# Patient Record
Sex: Female | Born: 1940
Health system: Southern US, Community
[De-identification: ages and names within clinical notes are randomized; demographics above are authoritative.]

## PROBLEM LIST (undated history)

## (undated) DIAGNOSIS — K5790 Diverticulosis of intestine, part unspecified, without perforation or abscess without bleeding: Secondary | ICD-10-CM

## (undated) DIAGNOSIS — D126 Benign neoplasm of colon, unspecified: Secondary | ICD-10-CM

## (undated) DIAGNOSIS — F0390 Unspecified dementia without behavioral disturbance: Secondary | ICD-10-CM

## (undated) DIAGNOSIS — I10 Essential (primary) hypertension: Secondary | ICD-10-CM

## (undated) DIAGNOSIS — K2210 Ulcer of esophagus without bleeding: Secondary | ICD-10-CM

## (undated) DIAGNOSIS — E785 Hyperlipidemia, unspecified: Secondary | ICD-10-CM

## (undated) DIAGNOSIS — K449 Diaphragmatic hernia without obstruction or gangrene: Secondary | ICD-10-CM

## (undated) DIAGNOSIS — E079 Disorder of thyroid, unspecified: Secondary | ICD-10-CM

## (undated) HISTORY — DX: Essential (primary) hypertension: I10

## (undated) HISTORY — DX: Ulcer of esophagus without bleeding: K22.10

## (undated) HISTORY — DX: Diaphragmatic hernia without obstruction or gangrene: K44.9

## (undated) HISTORY — DX: Hyperlipidemia, unspecified: E78.5

## (undated) HISTORY — DX: Benign neoplasm of colon, unspecified: D12.6

## (undated) HISTORY — DX: Diverticulosis of intestine, part unspecified, without perforation or abscess without bleeding: K57.90

## (undated) HISTORY — PX: THYROIDECTOMY, PARTIAL: SHX18

## (undated) HISTORY — PX: TONSILLECTOMY: SUR1361

## (undated) HISTORY — DX: Disorder of thyroid, unspecified: E07.9

---

## 2005-04-10 DIAGNOSIS — D126 Benign neoplasm of colon, unspecified: Secondary | ICD-10-CM

## 2005-04-10 HISTORY — DX: Benign neoplasm of colon, unspecified: D12.6

## 2006-10-17 ENCOUNTER — Encounter: Payer: Self-pay | Admitting: Family Medicine

## 2007-01-13 ENCOUNTER — Encounter: Payer: Self-pay | Admitting: Family Medicine

## 2007-04-06 ENCOUNTER — Ambulatory Visit: Payer: Self-pay | Admitting: Family Medicine

## 2007-04-06 DIAGNOSIS — E039 Hypothyroidism, unspecified: Secondary | ICD-10-CM

## 2007-04-06 DIAGNOSIS — I10 Essential (primary) hypertension: Secondary | ICD-10-CM | POA: Insufficient documentation

## 2007-04-06 DIAGNOSIS — E785 Hyperlipidemia, unspecified: Secondary | ICD-10-CM | POA: Insufficient documentation

## 2007-04-06 DIAGNOSIS — B009 Herpesviral infection, unspecified: Secondary | ICD-10-CM | POA: Insufficient documentation

## 2007-04-07 ENCOUNTER — Ambulatory Visit: Payer: Self-pay | Admitting: Family Medicine

## 2007-04-08 ENCOUNTER — Encounter: Payer: Self-pay | Admitting: Family Medicine

## 2007-04-08 LAB — CONVERTED CEMR LAB
ALT: 16 units/L (ref 0–35)
Albumin: 4 g/dL (ref 3.5–5.2)
Alkaline Phosphatase: 50 units/L (ref 39–117)
BUN: 14 mg/dL (ref 6–23)
Calcium: 9.9 mg/dL (ref 8.4–10.5)
Creatinine, Ser: 0.9 mg/dL (ref 0.4–1.2)
GFR calc Af Amer: 80 mL/min
HDL: 53.4 mg/dL (ref >39.0)
Potassium: 3.5 meq/L (ref 3.5–5.1)
TSH: 1.89 microintl units/mL (ref 0.35–5.50)
Triglycerides: 148 mg/dL (ref 0–149)
VLDL: 30 mg/dL (ref 0–40)

## 2007-04-09 LAB — CONVERTED CEMR LAB
PTH: 60.7 pg/mL (ref 14.0–72.0)
Vit D, 1,25-Dihydroxy: 41 (ref 30–89)

## 2007-04-15 ENCOUNTER — Telehealth: Payer: Self-pay | Admitting: Family Medicine

## 2007-05-05 ENCOUNTER — Encounter: Payer: Self-pay | Admitting: Family Medicine

## 2007-05-18 ENCOUNTER — Encounter (INDEPENDENT_AMBULATORY_CARE_PROVIDER_SITE_OTHER): Payer: Self-pay | Admitting: *Deleted

## 2007-05-22 ENCOUNTER — Encounter: Payer: Self-pay | Admitting: Family Medicine

## 2007-06-09 ENCOUNTER — Encounter: Payer: Self-pay | Admitting: Family Medicine

## 2007-06-26 ENCOUNTER — Telehealth: Payer: Self-pay | Admitting: Family Medicine

## 2007-07-01 ENCOUNTER — Ambulatory Visit: Payer: Self-pay | Admitting: Family Medicine

## 2007-07-21 ENCOUNTER — Encounter (INDEPENDENT_AMBULATORY_CARE_PROVIDER_SITE_OTHER): Payer: Self-pay | Admitting: Obstetrics and Gynecology

## 2007-07-21 ENCOUNTER — Ambulatory Visit (HOSPITAL_COMMUNITY): Admission: RE | Admit: 2007-07-21 | Discharge: 2007-07-21 | Payer: Self-pay | Admitting: Obstetrics and Gynecology

## 2007-09-11 ENCOUNTER — Ambulatory Visit: Payer: Self-pay | Admitting: Family Medicine

## 2007-09-11 LAB — CONVERTED CEMR LAB
OCCULT 1: NEGATIVE
OCCULT 2: NEGATIVE

## 2007-10-09 ENCOUNTER — Ambulatory Visit: Payer: Self-pay | Admitting: Family Medicine

## 2007-10-14 ENCOUNTER — Ambulatory Visit: Payer: Self-pay | Admitting: Family Medicine

## 2007-10-14 LAB — CONVERTED CEMR LAB
BUN: 13 mg/dL (ref 6–23)
Calcium: 10.3 mg/dL (ref 8.4–10.5)
Creatinine, Ser: 1.1 mg/dL (ref 0.4–1.2)
GFR calc Af Amer: 64 mL/min
Glucose, Bld: 88 mg/dL (ref 70–99)

## 2008-03-10 LAB — CONVERTED CEMR LAB

## 2008-03-30 ENCOUNTER — Ambulatory Visit: Payer: Self-pay | Admitting: Family Medicine

## 2008-04-01 LAB — CONVERTED CEMR LAB
Albumin: 3.8 g/dL (ref 3.5–5.2)
Alkaline Phosphatase: 38 units/L — ABNORMAL LOW (ref 39–117)
BUN: 12 mg/dL (ref 6–23)
Calcium: 9.7 mg/dL (ref 8.4–10.5)
Creatinine, Ser: 0.9 mg/dL (ref 0.4–1.2)
GFR calc Af Amer: 80 mL/min
Glucose, Bld: 96 mg/dL (ref 70–99)
TSH: 1.64 microintl units/mL (ref 0.35–5.50)
Total Protein: 7.2 g/dL (ref 6.0–8.3)

## 2008-08-15 ENCOUNTER — Ambulatory Visit: Payer: Self-pay | Admitting: Family Medicine

## 2008-08-15 DIAGNOSIS — Z87891 Personal history of nicotine dependence: Secondary | ICD-10-CM

## 2008-08-18 ENCOUNTER — Encounter: Admission: RE | Admit: 2008-08-18 | Discharge: 2008-08-18 | Payer: Self-pay | Admitting: Family Medicine

## 2008-08-19 ENCOUNTER — Telehealth: Payer: Self-pay | Admitting: Family Medicine

## 2008-08-22 LAB — CONVERTED CEMR LAB
Albumin: 4 g/dL (ref 3.5–5.2)
BUN: 10 mg/dL (ref 6–23)
Calcium: 10.1 mg/dL (ref 8.4–10.5)
Cholesterol: 281 mg/dL (ref 0–200)
Direct LDL: 186.2 mg/dL
GFR calc Af Amer: 92 mL/min
Glucose, Bld: 85 mg/dL (ref 70–99)
HCT: 48.1 % — ABNORMAL HIGH (ref 36.0–46.0)
Hemoglobin: 16.5 g/dL — ABNORMAL HIGH (ref 12.0–15.0)
Monocytes Absolute: 0.6 10*3/uL (ref 0.1–1.0)
Monocytes Relative: 6.9 % (ref 3.0–12.0)
Neutro Abs: 5.6 10*3/uL (ref 1.4–7.7)
RDW: 12.7 % (ref 11.5–14.6)
Sodium: 143 meq/L (ref 135–145)
Total CHOL/HDL Ratio: 4.9
Total Protein: 7 g/dL (ref 6.0–8.3)

## 2008-08-23 ENCOUNTER — Encounter: Payer: Self-pay | Admitting: Family Medicine

## 2008-08-25 ENCOUNTER — Encounter (INDEPENDENT_AMBULATORY_CARE_PROVIDER_SITE_OTHER): Payer: Self-pay | Admitting: *Deleted

## 2008-08-25 ENCOUNTER — Ambulatory Visit: Payer: Self-pay | Admitting: Family Medicine

## 2008-08-25 DIAGNOSIS — D751 Secondary polycythemia: Secondary | ICD-10-CM | POA: Insufficient documentation

## 2008-08-25 DIAGNOSIS — R930 Abnormal findings on diagnostic imaging of skull and head, not elsewhere classified: Secondary | ICD-10-CM

## 2008-08-26 ENCOUNTER — Encounter: Payer: Self-pay | Admitting: Family Medicine

## 2008-08-26 ENCOUNTER — Ambulatory Visit: Payer: Self-pay

## 2008-11-09 ENCOUNTER — Ambulatory Visit: Payer: Self-pay | Admitting: Family Medicine

## 2008-11-09 ENCOUNTER — Ambulatory Visit: Payer: Self-pay | Admitting: Gastroenterology

## 2008-11-11 ENCOUNTER — Telehealth: Payer: Self-pay | Admitting: Family Medicine

## 2008-11-11 LAB — CONVERTED CEMR LAB
Basophils Relative: 0.3 % (ref 0.0–3.0)
Eosinophils Relative: 3.2 % (ref 0.0–5.0)
Lymphocytes Relative: 16.5 % (ref 12.0–46.0)
Neutrophils Relative %: 74 % (ref 43.0–77.0)
RBC: 4.99 M/uL (ref 3.87–5.11)
WBC: 7.8 10*3/uL (ref 4.5–10.5)

## 2008-11-22 ENCOUNTER — Ambulatory Visit: Payer: Self-pay | Admitting: Gastroenterology

## 2008-11-22 ENCOUNTER — Encounter: Payer: Self-pay | Admitting: Gastroenterology

## 2008-11-22 LAB — HM COLONOSCOPY

## 2008-11-23 ENCOUNTER — Encounter: Payer: Self-pay | Admitting: Gastroenterology

## 2008-12-20 ENCOUNTER — Ambulatory Visit: Payer: Self-pay | Admitting: Family Medicine

## 2008-12-20 DIAGNOSIS — M109 Gout, unspecified: Secondary | ICD-10-CM

## 2008-12-21 LAB — CONVERTED CEMR LAB: Calcium: 9.5 mg/dL (ref 8.4–10.5)

## 2008-12-23 ENCOUNTER — Telehealth: Payer: Self-pay | Admitting: Family Medicine

## 2008-12-28 ENCOUNTER — Ambulatory Visit: Payer: Self-pay | Admitting: Family Medicine

## 2009-01-02 LAB — CONVERTED CEMR LAB: Creatinine, Ser: 0.9 mg/dL (ref 0.4–1.2)

## 2009-01-26 ENCOUNTER — Ambulatory Visit: Payer: Self-pay | Admitting: Family Medicine

## 2009-01-28 ENCOUNTER — Telehealth: Payer: Self-pay | Admitting: Family Medicine

## 2009-01-28 ENCOUNTER — Encounter: Payer: Self-pay | Admitting: Family Medicine

## 2009-01-28 ENCOUNTER — Ambulatory Visit: Payer: Self-pay | Admitting: Family Medicine

## 2009-01-28 DIAGNOSIS — K921 Melena: Secondary | ICD-10-CM

## 2009-01-31 ENCOUNTER — Ambulatory Visit: Payer: Self-pay | Admitting: Family Medicine

## 2009-01-31 LAB — CONVERTED CEMR LAB
ALT: 18 units/L (ref 0–35)
Albumin: 3.9 g/dL (ref 3.5–5.2)
Alkaline Phosphatase: 52 units/L (ref 39–117)
CO2: 31 meq/L (ref 19–32)
Calcium: 9.6 mg/dL (ref 8.4–10.5)
Cholesterol: 235 mg/dL — ABNORMAL HIGH (ref 0–200)
Creatinine, Ser: 0.7 mg/dL (ref 0.4–1.2)
Direct LDL: 155.9 mg/dL
Glucose, Bld: 97 mg/dL (ref 70–99)
Sodium: 142 meq/L (ref 135–145)
Total Protein: 6.8 g/dL (ref 6.0–8.3)
Triglycerides: 116 mg/dL (ref 0.0–149.0)

## 2009-02-01 ENCOUNTER — Telehealth: Payer: Self-pay | Admitting: Family Medicine

## 2009-02-06 ENCOUNTER — Telehealth: Payer: Self-pay | Admitting: Family Medicine

## 2009-03-07 ENCOUNTER — Telehealth: Payer: Self-pay | Admitting: Family Medicine

## 2009-03-10 LAB — HM PAP SMEAR

## 2009-03-16 ENCOUNTER — Ambulatory Visit: Payer: Self-pay | Admitting: Family Medicine

## 2009-04-28 ENCOUNTER — Ambulatory Visit: Payer: Self-pay | Admitting: Family Medicine

## 2009-05-03 ENCOUNTER — Encounter (INDEPENDENT_AMBULATORY_CARE_PROVIDER_SITE_OTHER): Payer: Self-pay | Admitting: *Deleted

## 2009-05-03 ENCOUNTER — Ambulatory Visit: Payer: Self-pay | Admitting: Family Medicine

## 2009-05-03 LAB — CONVERTED CEMR LAB: OCCULT 3: NEGATIVE

## 2009-05-11 ENCOUNTER — Ambulatory Visit: Payer: Self-pay | Admitting: Family Medicine

## 2009-05-11 DIAGNOSIS — M79609 Pain in unspecified limb: Secondary | ICD-10-CM

## 2009-05-16 ENCOUNTER — Encounter: Payer: Self-pay | Admitting: Family Medicine

## 2009-05-29 ENCOUNTER — Telehealth (INDEPENDENT_AMBULATORY_CARE_PROVIDER_SITE_OTHER): Payer: Self-pay | Admitting: *Deleted

## 2009-05-31 ENCOUNTER — Ambulatory Visit: Payer: Self-pay | Admitting: Family Medicine

## 2009-05-31 DIAGNOSIS — M129 Arthropathy, unspecified: Secondary | ICD-10-CM | POA: Insufficient documentation

## 2009-06-01 LAB — CONVERTED CEMR LAB
Rhuematoid fact SerPl-aCnc: <20 intl units/mL (ref 0.0–20.0)
Sed Rate: 39 mm/hr — ABNORMAL HIGH (ref 0–22)
Uric Acid, Serum: 4.2 mg/dL (ref 2.4–7.0)

## 2009-06-06 ENCOUNTER — Telehealth: Payer: Self-pay | Admitting: Family Medicine

## 2009-06-15 ENCOUNTER — Ambulatory Visit: Payer: Self-pay | Admitting: Family Medicine

## 2009-06-16 ENCOUNTER — Telehealth: Payer: Self-pay | Admitting: Family Medicine

## 2009-10-31 ENCOUNTER — Ambulatory Visit: Payer: Self-pay | Admitting: Family Medicine

## 2009-11-01 LAB — CONVERTED CEMR LAB
ALT: 14 units/L (ref 0–35)
AST: 16 units/L (ref 0–37)
Alkaline Phosphatase: 61 units/L (ref 39–117)
Bilirubin, Direct: 0.2 mg/dL (ref 0.0–0.3)
CO2: 31 meq/L (ref 19–32)
Chloride: 104 meq/L (ref 96–112)
Cholesterol: 257 mg/dL — ABNORMAL HIGH (ref 0–200)
Creatinine, Ser: 0.8 mg/dL (ref 0.4–1.2)
Potassium: 4.1 meq/L (ref 3.5–5.1)
Sodium: 143 meq/L (ref 135–145)
Total Bilirubin: 1 mg/dL (ref 0.3–1.2)
Total CHOL/HDL Ratio: 4
Total Protein: 7.5 g/dL (ref 6.0–8.3)

## 2009-11-03 ENCOUNTER — Ambulatory Visit: Payer: Self-pay | Admitting: Family Medicine

## 2009-11-03 DIAGNOSIS — R1013 Epigastric pain: Secondary | ICD-10-CM | POA: Insufficient documentation

## 2009-11-24 ENCOUNTER — Ambulatory Visit: Payer: Self-pay | Admitting: Family Medicine

## 2009-11-24 LAB — CONVERTED CEMR LAB
OCCULT 1: NEGATIVE
OCCULT 2: NEGATIVE
OCCULT 3: NEGATIVE

## 2009-12-19 ENCOUNTER — Ambulatory Visit: Payer: Self-pay | Admitting: Family Medicine

## 2009-12-24 LAB — CONVERTED CEMR LAB
Basophils Absolute: 0 10*3/uL (ref 0.0–0.1)
Basophils Relative: 0.5 % (ref 0.0–3.0)
Eosinophils Absolute: 0.3 10*3/uL (ref 0.0–0.7)
Hemoglobin: 16 g/dL — ABNORMAL HIGH (ref 12.0–15.0)
Lymphocytes Relative: 21.3 % (ref 12.0–46.0)
Monocytes Relative: 6.2 % (ref 3.0–12.0)
Neutro Abs: 6.3 10*3/uL (ref 1.4–7.7)
Neutrophils Relative %: 68.7 % (ref 43.0–77.0)
RBC: 5.05 M/uL (ref 3.87–5.11)
Uric Acid, Serum: 6.7 mg/dL (ref 2.4–7.0)

## 2010-01-29 ENCOUNTER — Encounter (INDEPENDENT_AMBULATORY_CARE_PROVIDER_SITE_OTHER): Payer: Self-pay | Admitting: *Deleted

## 2010-01-29 ENCOUNTER — Telehealth: Payer: Self-pay | Admitting: Family Medicine

## 2010-03-29 ENCOUNTER — Ambulatory Visit: Payer: Self-pay | Admitting: Gastroenterology

## 2010-03-29 DIAGNOSIS — R195 Other fecal abnormalities: Secondary | ICD-10-CM | POA: Insufficient documentation

## 2010-03-29 DIAGNOSIS — K219 Gastro-esophageal reflux disease without esophagitis: Secondary | ICD-10-CM

## 2010-03-29 DIAGNOSIS — Z860100 Personal history of colon polyps, unspecified: Secondary | ICD-10-CM | POA: Insufficient documentation

## 2010-04-02 ENCOUNTER — Ambulatory Visit: Payer: Self-pay | Admitting: Gastroenterology

## 2010-04-03 ENCOUNTER — Encounter: Payer: Self-pay | Admitting: Family Medicine

## 2010-04-03 ENCOUNTER — Telehealth: Payer: Self-pay | Admitting: Family Medicine

## 2010-04-07 ENCOUNTER — Encounter: Payer: Self-pay | Admitting: Family Medicine

## 2010-04-13 ENCOUNTER — Ambulatory Visit: Payer: Self-pay | Admitting: Family Medicine

## 2010-04-13 DIAGNOSIS — R350 Frequency of micturition: Secondary | ICD-10-CM

## 2010-04-13 DIAGNOSIS — N952 Postmenopausal atrophic vaginitis: Secondary | ICD-10-CM

## 2010-04-13 LAB — CONVERTED CEMR LAB
Bilirubin Urine: NEGATIVE
Blood in Urine, dipstick: NEGATIVE
Glucose, Urine, Semiquant: NEGATIVE
Protein, U semiquant: 30
Specific Gravity, Urine: 1.02
WBC, UA: 2 cells/hpf
pH: 6

## 2010-04-30 ENCOUNTER — Ambulatory Visit: Payer: Self-pay | Admitting: Family Medicine

## 2010-05-02 LAB — CONVERTED CEMR LAB
ALT: 14 units/L (ref 0–35)
AST: 17 units/L (ref 0–37)
Albumin: 4.1 g/dL (ref 3.5–5.2)
Alkaline Phosphatase: 53 units/L (ref 39–117)
BUN: 10 mg/dL (ref 6–23)
CO2: 28 meq/L (ref 19–32)
Cholesterol: 279 mg/dL — ABNORMAL HIGH (ref 0–200)
Direct LDL: 179.3 mg/dL
Glucose, Bld: 96 mg/dL (ref 70–99)
Potassium: 4 meq/L (ref 3.5–5.1)
Sodium: 142 meq/L (ref 135–145)
Total Protein: 7 g/dL (ref 6.0–8.3)

## 2010-05-18 ENCOUNTER — Ambulatory Visit: Payer: Self-pay | Admitting: Family Medicine

## 2010-05-18 ENCOUNTER — Telehealth: Payer: Self-pay | Admitting: Family Medicine

## 2010-05-18 DIAGNOSIS — M549 Dorsalgia, unspecified: Secondary | ICD-10-CM | POA: Insufficient documentation

## 2010-05-18 DIAGNOSIS — R21 Rash and other nonspecific skin eruption: Secondary | ICD-10-CM

## 2010-05-18 LAB — CONVERTED CEMR LAB
Cholesterol, target level: 200 mg/dL
HDL goal, serum: 40 mg/dL
LDL Goal: 160 mg/dL

## 2010-05-22 ENCOUNTER — Telehealth: Payer: Self-pay | Admitting: Family Medicine

## 2010-05-28 ENCOUNTER — Telehealth: Payer: Self-pay | Admitting: Family Medicine

## 2010-07-10 NOTE — Letter (Signed)
Summary: EGD Instructions  [facility]  [address] [place], [place] [address]   Phone: [phone]  Fax: [phone]       Gloria Lloyd    Aug 25, 1940    MRN: [id number]       Procedure Day Dorna Bloom: October 24th, 2011     Arrival Time:  2:00pm     Procedure Time: 3:00pm     Location of Procedure:                    _ x _ [facility] (4th Floor)    PREPARATION FOR ENDOSCOPY   On 04/02/10 THE DAY OF THE PROCEDURE:  1.   No solid foods, milk or milk products are allowed after midnight the night before your procedure.  2.   Do not drink anything colored red or purple.  Avoid juices with pulp.  No orange juice.  3.  You may drink clear liquids until 1:00pm, which is 2 hours before your procedure.                                                                                                CLEAR LIQUIDS INCLUDE: Water Jello Ice Popsicles Tea (sugar ok, no milk/cream) Powdered fruit flavored drinks Coffee (sugar ok, no milk/cream) Gatorade Juice: apple, white grape, white cranberry  Lemonade Clear bullion, consomm, broth Carbonated beverages (any kind) Strained chicken noodle soup Hard Candy   MEDICATION INSTRUCTIONS  Unless otherwise instructed, you should take regular prescription medications with a small sip of water as early as possible the morning of your procedure.       OTHER INSTRUCTIONS  You will need a responsible adult at least 71 years of age to accompany you and drive you home.   This person must remain in the waiting room during your procedure.  Wear loose fitting clothing that is easily removed.  Leave jewelry and other valuables at home.  However, you may wish to bring a book to read or an iPod/MP3 player to listen to music as you wait for your procedure to start.  Remove all body piercing jewelry and leave at home.  Total time from sign-in until discharge is approximately 2-3 hours.  You should go home directly  after your procedure and rest.  You can resume normal activities the day after your procedure.  The day of your procedure you should not:   Drive   Make legal decisions   Operate machinery   Drink alcohol   Return to work  You will receive specific instructions about eating, activities and medications before you leave.    The above instructions have been reviewed and explained to me by   _______________________    I fully understand and can verbalize these instructions _____________________________ Date _________

## 2010-07-10 NOTE — Progress Notes (Signed)
Summary: Elocon Cream (Brand Name Only)  Phone Note Call from Patient Call back at Home Phone [phone]   Caller: Patient Call For: Gloria Nora MD Summary of Call: Patient asks that a prescription for Elocon Cream be sent to [facility] on [address].  She needs the brand name only.  She says the generic does not cover her symptoms nearly as good.  Again:  Brand Name Only.  Initial call taken by: Delilah Shan CMA (AAMA),  May 18, 2010 1:12 PM    Prescriptions: ELOCON 0.1 % CREA (MOMETASONE FUROATE) AAA two times a day as needed rash Brand medically necessary #45 gm x 3   Entered and Authorized by:   Gloria Nora MD   Signed by:   Gloria Nora MD on 05/18/2010   Method used:   Electronically to        [facility] [phone]* (retail)       [address]       [place], [place]  [address]       Ph: [phone]       Fax: [phone]   RxID:   [id number]

## 2010-07-10 NOTE — Assessment & Plan Note (Signed)
Summary: ?UTI OR YEAST INFECTION/CLE   Vital Signs:  Patient profile:   70 year old female Height:      65.75 inches Weight:      154.0 pounds BMI:     25.14 Temp:     97.6 degrees F oral Pulse rate:   84 / minute Pulse rhythm:   regular BP sitting:   130 / 88  (left arm) Cuff size:   regular  Vitals Entered By: Benny Lennert CMA Duncan Dull) (April 13, 2010 8:36 AM)  History of Present Illness: Chief complaint ? UTI or yeast infection  In past few weeks... urine occ cloudy and odor, slight increase in urinary frequency.  No urinary urgency. Worst in AM..but constant for past few weeks. No vaginal irritation but more dryness.. no vaginal discharge.  No new meds.  Recent diagnosis of erosive esophagitis.Marland Kitchentreated with PPI.  Problems Prior to Update: 1)  Personal Hx Colonic Polyps  (ICD-V12.72) 2)  Gerd  (ICD-530.81) 3)  Nonspecific Abnormal Finding in Stool Contents  (ICD-792.1) 4)  Epigastric Pain  (ICD-789.06) 5)  Unspecified Arthropathy Multiple Sites  (ICD-716.99) 6)  Toe Pain  (ICD-729.5) 7)  Special Screening Malig Neoplasms Other Sites  (ICD-V76.49) 8)  Hemoccult Positive Stool  (ICD-578.1) 9)  Gout, Unspecified  (ICD-274.9) 10)  Polycythemia  (ICD-289.0) 11)  Abnormal Chest Xray  (ICD-793.1) 12)  Tobacco Use, Quit  (ICD-V15.82) 13)  Special Screening Malig Neoplasms Other Sites  (ICD-V76.49) 14)  Hypercalcemia  (ICD-275.42) 15)  Lupus, Cutaneous  (ICD-710.0) 16)  Hypertension  (ICD-401.9) 17)  Hyperlipidemia  (ICD-272.4) 18)  Disorder, Calcium Metabolism Nos  (ICD-275.40) 19)  Herpes Labialis  (ICD-054.9) 20)  Hypothyroidism  (ICD-244.9)  Current Medications (verified): 1)  Synthroid 125 Mcg  Tabs (Levothyroxine Sodium) .... Take 1 Tablet By Mouth Once A Day 2)  Atenolol 25 Mg  Tabs (Atenolol) .... Take 1 Tablet By Mouth Once A Day 3)  Vitamin D 1000 Unit  Tabs (Cholecalciferol) .... Vitamin D-3 Take 1 Tablet By Mouth Two Times A Day 4)  Fish Oil   Oil  (Fish Oil) .... Take 1 Tablet By Mouth Four Times A Day 5)  L-Lysine Hcl 500 Mg  Caps (Lysine Hcl) .... Take 1 Tablet By Mouth Once A Day 6)  Amlodipine Besylate 5 Mg Tabs (Amlodipine Besylate) .... Take 1 Tablet By Mouth Once A Day, If Tolerated May Increase To 10 Mg Daily 7)  Omeprazole 40 Mg Cpdr (Omeprazole) .... Take 1 By Mouth Once Daily 8)  Elocon 0.1 % Crea (Mometasone Furoate) .... Aaa Two Times A Day As Needed Rash 9)  Allopurinol 100 Mg Tabs (Allopurinol) .... Take 1 Tablet By Mouth Once A Day  Allergies: 1)  ! Sulfa 2)  ! Penicillin  Past History:  Past medical, surgical, family and social histories (including risk factors) reviewed, and no changes noted (except as noted below).  Past Medical History: Reviewed history from 03/29/2010 and no changes required. Hypothyroidism Hyperlipidemia Hypertension Diverticulosis Gout Adenomatous Colon Polyps 04/2005  Past Surgical History: Reviewed history from 04/06/2007 and no changes required. Thyroid nodule, toxic: partial thyroidectomy DXA 8/08 nml tranvaginal [place]: small fibroid, small questionable cyst Tonsillectomy CXR 8/08 wnl  Family History: Reviewed history from 04/06/2007 and no changes required. father died age 83 arrythmia, poor circulation mother died age 2 DM, HTN, high chol, hyperthyroid brother: healthy MGM: CAD, MGF: flu uncle lung cancer  Social History: Reviewed history from 08/15/2008 and no changes required. Occupation: retired  Married 2 daughters,  one lives in [place], one in [place] Former Smoker: >40 pack year history Alcohol use-yes, socailly Drug use-no Regular exercise-no, used to walk Diet: fruit and veggies, water  Review of Systems General:  Denies fatigue and fever. CV:  Denies chest pain or discomfort. Resp:  Denies shortness of breath. GI:  Denies abdominal pain and bloody stools.  Physical Exam  General:  Well-developed,well-nourished,in no acute distress;  alert,appropriate and cooperative throughout examination Mouth:  Oral mucosa and oropharynx without lesions or exudates.  Teeth in good repair. Abdomen:  Bowel sounds positive,abdomen soft and non-tender without masses, organomegaly or hernias noted. Genitalia:  no discharge, no masses, no lesions..skin very dry and friable, several small abrasions. Pulses:  R and L posterior tibial pulses are full and equal bilaterally  Extremities:  no edema   Impression & Recommendations:  Problem # 1:  VAGINITIS, ATROPHIC (ICD-627.3)  Her updated medication list for this problem includes:    Premarin 0.625 Mg/gm Crea (Estrogens, conjugated) .Marland Kitchen... 2 gm per vaginally daily x 1 week then decrease to 2 g per vag 1-3 times per week.Marland Kitchenas symptoms allow  Problem # 2:  URINARY FREQUENCY (ICD-788.41) No sign of urinary infection. Push fluids.  Orders: UA Dipstick W/ Micro (manual) (27253)  Complete Medication List: 1)  Synthroid 125 Mcg Tabs (Levothyroxine sodium) .... Take 1 tablet by mouth once a day 2)  Atenolol 25 Mg Tabs (Atenolol) .... Take 1 tablet by mouth once a day 3)  Vitamin D 1000 Unit Tabs (Cholecalciferol) .... Vitamin d-3 take 1 tablet by mouth two times a day 4)  Fish Oil Oil (Fish oil) .... Take 1 tablet by mouth four times a day 5)  L-lysine Hcl 500 Mg Caps (Lysine hcl) .... Take 1 tablet by mouth once a day 6)  Amlodipine Besylate 5 Mg Tabs (Amlodipine besylate) .... Take 1 tablet by mouth once a day, if tolerated may increase to 10 mg daily 7)  Omeprazole 40 Mg Cpdr (Omeprazole) .... Take 1 by mouth once daily 8)  Elocon 0.1 % Crea (Mometasone furoate) .... Aaa two times a day as needed rash 9)  Allopurinol 100 Mg Tabs (Allopurinol) .... Take 1 tablet by mouth once a day 10)  Premarin 0.625 Mg/gm Crea (Estrogens, conjugated) .... 2 gm per vaginally daily x 1 week then decrease to 2 g per vag 1-3 times per week.Marland Kitchenas symptoms allow  Patient Instructions: 1)  Start vaginal cream.  2)    Push fluids...lots of water.  3)  Follow up if not improving.  4)  Schedule 6 mo follow up in next 1-2 months with fasting labs prior..Dx 272.0 CMEt,lipids. Prescriptions: PREMARIN 0.625 MG/GM CREA (ESTROGENS, CONJUGATED) 2 gm per vaginally daily x 1 week then decrease to 2 g per vag 1-3 times per week.Marland Kitchenas symptoms allow  #1 tube x 1   Entered and Authorized by:   Kerby Nora MD   Signed by:   Kerby Nora MD on 04/13/2010   Method used:   Electron[facility] to        [facility]  [address] [phone]* (retail)       [address]       [place], [place]  [address]       Ph: [phone]       Fax: [phone]   RxID:   [id number]    Orders Added: 1)  UA Dipstick W/ Micro (manual) [81000] 2)  Est. Patient Level III [55732]  Current Allergies (reviewed today): ! SULFA ! PENICILLIN  Laboratory Results   Urine Tests  Date/Time Received: April 13, 2010 8:47 AM  Date/Time Reported: April 13, 2010 8:47 AM   Routine Urinalysis   Color: yellow Appearance: Clear Glucose: negative   (Normal Range: Negative) Bilirubin: negative   (Normal Range: Negative) Ketone: negative   (Normal Range: Negative) Spec. Gravity: 1.020   (Normal Range: 1.003-1.035) Blood: negative   (Normal Range: Negative) pH: 6.0   (Normal Range: 5.0-8.0) Protein: 30   (Normal Range: Negative) Urobilinogen: 0.2   (Normal Range: 0-1) Nitrite: negative   (Normal Range: Negative) Leukocyte Esterace: trace   (Normal Range: Negative)  Urine Microscopic WBC/HPF: 2 RBC/HPF: 0 Bacteria/HPF: none Mucous/HPF: occ Epithelial/HPF: occ Casts/LPF: none

## 2010-07-10 NOTE — Letter (Signed)
Summary: [facility] [place] / Mammo appt  [facility] / Mammo appt   Imported By: Carin Primrose 04/03/2010 11:48:08  _____________________________________________________________________  External Attachment:    Type:   Image     Comment:   External Document

## 2010-07-10 NOTE — Consult Note (Signed)
Summary: [facility]  [facility]   Imported By: Lanelle Bal 09/05/2009 13:49:35  _____________________________________________________________________  External Attachment:    Type:   Image     Comment:   External Document

## 2010-07-10 NOTE — Progress Notes (Signed)
Summary: Recd appt via fax of Mammogram   Phone Note Other Incoming   Summary of Call: Screening Mammogram scheduled on Oct 29th at 1:40am at [facility], [place].Marland KitchenMarland KitchenJust an fyi to PCP.Daine Gip  April 03, 2010 11:44 AM  Initial call taken by: Daine Gip,  April 03, 2010 11:44 AM

## 2010-07-10 NOTE — Assessment & Plan Note (Signed)
Summary: CPX/DLO   Vital Signs:  Patient profile:   70 year old female Height:      65.75 inches Weight:      155.0 pounds BMI:     25.30 Temp:     98.1 degrees F oral Pulse rate:   72 / minute Pulse rhythm:   regular BP sitting:   120 / 80  (left arm) Cuff size:   regular  Vitals Entered By: Benny Lennert CMA Duncan Dull) (Nov 03, 2009 2:02 PM)  History of Present Illness: Chief complaint  Chronic Med Managment.  HTN, well controlled on amlodipine and atenolol  High chol..refused statin medicaiotn in past  Hypothyroid: slightly overtreated per TSH  Gout, uric acid at goal <7, could be lower...less than 6...on allopruinol 100 mg daily Finger pain resolved.  Toe pain resolved, but white prominence remains.        Problems Prior to Update: 1)  Unspecified Arthropathy Multiple Sites  (ICD-716.99) 2)  Toe Pain  (ICD-729.5) 3)  Special Screening Malig Neoplasms Other Sites  (ICD-V76.49) 4)  Hemoccult Positive Stool  (ICD-578.1) 5)  Gout, Unspecified  (ICD-274.9) 6)  Polycythemia  (ICD-289.0) 7)  Abnormal Chest Xray  (ICD-793.1) 8)  Tobacco Use, Quit  (ICD-V15.82) 9)  Special Screening Malig Neoplasms Other Sites  (ICD-V76.49) 10)  Hypercalcemia  (ICD-275.42) 11)  Lupus, Cutaneous  (ICD-710.0) 12)  Hypertension  (ICD-401.9) 13)  Hyperlipidemia  (ICD-272.4) 14)  Disorder, Calcium Metabolism Nos  (ICD-275.40) 15)  Herpes Labialis  (ICD-054.9) 16)  Hypothyroidism  (ICD-244.9)  Current Medications (verified): 1)  Synthroid 125 Mcg  Tabs (Levothyroxine Sodium) .... Take 1 Tablet By Mouth Once A Day 2)  Atenolol 25 Mg  Tabs (Atenolol) .... Take 1 Tablet By Mouth Once A Day 3)  Vitamin D 1000 Unit  Tabs (Cholecalciferol) .... Vitamin D-3 Take 1 Tablet By Mouth Two Times A Day 4)  Fish Oil   Oil (Fish Oil) .... Take 1 Tablet By Mouth Four Times A Day 5)  L-Lysine Hcl 500 Mg  Caps (Lysine Hcl) .... Take 1 Tablet By Mouth Once A Day 6)  Amlodipine Besylate 5 Mg Tabs (Amlodipine  Besylate) .... Take 1 Tablet By Mouth Once A Day, If Tolerated May Increase To 10 Mg Daily 7)  Omeprazole 40 Mg Cpdr (Omeprazole) .... Take 1 By Mouth Once Daily 8)  Elocon 0.1 % Crea (Mometasone Furoate) .... Aaa Two Times A Day As Needed Rash 9)  Allopurinol 100 Mg Tabs (Allopurinol) .... Take 1 Tablet By Mouth Once A Day  Allergies: 1)  ! Sulfa 2)  ! Penicillin  Past History:  Past medical, surgical, family and social histories (including risk factors) reviewed, and no changes noted (except as noted below).  Past Medical History: Reviewed history from 04/06/2007 and no changes required. Hypothyroidism Hyperlipidemia Hypertension  Past Surgical History: Reviewed history from 04/06/2007 and no changes required. Thyroid nodule, toxic: partial thyroidectomy DXA 8/08 nml tranvaginal [place]: small fibroid, small questionable cyst Tonsillectomy CXR 8/08 wnl  Family History: Reviewed history from 04/06/2007 and no changes required. father died age 71 arrythmia, poor circulation mother died age 36 DM, HTN, high chol, hyperthyroid brother: healthy MGM: CAD, MGF: flu uncle lung cancer  Social History: Reviewed history from 08/15/2008 and no changes required. Occupation: retired  Married 2 daughters, one lives in [place], one in [place] Former Smoker: >40 pack year history Alcohol use-yes, socailly Drug use-no Regular exercise-no, used to walk Diet: fruit and veggies, water  Review of Systems General:  Denies fatigue and fever; occ slightly shaky. CV:  Denies chest pain or discomfort. Resp:  Denies shortness of breath. GI:  Denies bloody stools, constipation, diarrhea, indigestion, loss of appetite, nausea, and vomiting; Abdominal pressure in epigastrum continues, no really pain. prilosec helps this when she can.  No exertional component Daily BM..no blood, no darkness. . GU:  Denies dysuria.  Physical Exam  General:  Well-developed,well-nourished,in no acute  distress; alert,appropriate and cooperative throughout examination Eyes:  No corneal or conjunctival inflammation noted. EOMI. Perrla. Funduscopic exam benign, without hemorrhages, exudates or papilledema. Vision grossly normal. Ears:  no external deformities.   Nose:  no external deformity.   Mouth:  MMM Neck:  no carotid bruit or thyromegaly no cervical or supraclavicular lymphadenopathy  Lungs:  Normal respiratory effort, chest expands symmetrically. Lungs are clear to auscultation, no crackles or wheezes. Heart:  Normal rate and regular rhythm. S1 and S2 normal without gallop, murmur, click, rub or other extra sounds. Abdomen:  mild ttp epigastrum, no rebound , no guarding, no abdominal hernia, no inguinal hernia, no hepatomegaly, and no splenomegaly.   Genitalia:  refused Pulses:  R and L posterior tibial pulses are full and equal bilaterally  Extremities:  no edema  Skin:  Intact without suspicious lesions or rashes Psych:  Cognition and judgment appear intact. Alert and cooperative with normal attention span and concentration. No apparent delusions, illusions, hallucinations   Impression & Recommendations:  Problem # 1:  EPIGASTRIC PAIN (ICD-789.06) Has had residual abdominal  'not pain but funny feeling" in epigastrum ever since last fall when on NSAIDs a and had large dark stool. H. Pylori was neg. No compliant with PPI.  never followed up with Dr. Russella Dar as recommended.  Recommended restart 40 mg of prilosec and returning to Dr. Russella Dar for likely EGD. WIll repeat stool cards (note on past follow up remianed neg, and nml Hg and no further dark stools. Avoid NSAIDs  Problem # 2:  HEMOCCULT POSITIVE STOOL (ICD-578.1) See above.   Problem # 3:  GOUT, UNSPECIFIED (ICD-274.9) Continue allopurinol...once I eval completed.Marland Kitchenand if allopurinol not felt to be causing abd symptoms..consider increasing to 200-300 mg daily.  The following medications were removed from the medication list:     Colchicine 0.6 Mg Tabs (Colchicine) .Marland Kitchen... Take 1 tablet by mouth once a day    Diclofenac Sodium 75 Mg Tbec (Diclofenac sodium) .Marland Kitchen... 1 by mouth two times a day Her updated medication list for this problem includes:    Allopurinol 100 Mg Tabs (Allopurinol) .Marland Kitchen... Take 1 tablet by mouth once a day  Problem # 4:  HYPERTENSION (ICD-401.9) Well controlled. Continue current medication.  Her updated medication list for this problem includes:    Atenolol 25 Mg Tabs (Atenolol) .Marland Kitchen... Take 1 tablet by mouth once a day    Amlodipine Besylate 5 Mg Tabs (Amlodipine besylate) .Marland Kitchen... Take 1 tablet by mouth once a day, if tolerated may increase to 10 mg daily  Orders: Prescription Created Electronically [phone])  Problem # 5:  HYPERLIPIDEMIA (ICD-272.4) Poor control...needs statin.Marland Kitchenshe will consider pravastatin vs. red yeast rice. Schedule recheck accordingly.   Problem # 6:  HYPOTHYROIDISM (ICD-244.9) Low nml TSH recheck in 4-6 weeks.  Her updated medication list for this problem includes:    Synthroid 125 Mcg Tabs (Levothyroxine sodium) .Marland Kitchen... Take 1 tablet by mouth once a day  Complete Medication List: 1)  Synthroid 125 Mcg Tabs (Levothyroxine sodium) .... Take 1 tablet by mouth once a day 2)  Atenolol 25 Mg  Tabs (Atenolol) .... Take 1 tablet by mouth once a day 3)  Vitamin D 1000 Unit Tabs (Cholecalciferol) .... Vitamin d-3 take 1 tablet by mouth two times a day 4)  Fish Oil Oil (Fish oil) .... Take 1 tablet by mouth four times a day 5)  L-lysine Hcl 500 Mg Caps (Lysine hcl) .... Take 1 tablet by mouth once a day 6)  Amlodipine Besylate 5 Mg Tabs (Amlodipine besylate) .... Take 1 tablet by mouth once a day, if tolerated may increase to 10 mg daily 7)  Omeprazole 40 Mg Cpdr (Omeprazole) .... Take 1 by mouth once daily 8)  Elocon 0.1 % Crea (Mometasone furoate) .... Aaa two times a day as needed rash 9)  Allopurinol 100 Mg Tabs (Allopurinol) .... Take 1 tablet by mouth once a day  Patient  Instructions: 1)  Recheck TSH in 4-6 weeks Dx 244.9 2)  Start back prilosec daily. 3)   Make follow up appt with Dr. Russella Dar for eval of past dark stool and current epigastric pain.  4)  Complete stool cards to reeval for blood in stool.  5)  Ccall insurance regarding shingles vaccine. 6)  Follow up appt in 6 months with fasting lipdis, MCET prior.  7)  Cinsider red yeast rice 2400 mg daily Prescriptions: AMLODIPINE BESYLATE 5 MG TABS (AMLODIPINE BESYLATE) Take 1 tablet by mouth once a day, if tolerated may increase to 10 mg daily  #90 x 3   Entered and Authorized by:   Kerby Nora MD   Signed by:   Kerby Nora MD on 11/03/2009   Method used:   [facility]  [address] [phone]* (retail)       [address]       [place], [place]  [address]       Ph: [phone]       Fax: [phone]   RxID:   [id number]   Current Allergies (reviewed today): ! SULFA ! PENICILLIN   Past Medical History:    Reviewed history from 04/06/2007 and no changes required:       Hypothyroidism       Hyperlipidemia       Hypertension  Past Surgical History:    Reviewed history from 04/06/2007 and no changes required:       Thyroid nodule, toxic: partial thyroidectomy       DXA 8/08 nml       tranvaginal [place]: small fibroid, small questionable cyst       Tonsillectomy       CXR 8/08 wnl   Last Flu Vaccine:  Fluvax 3+ (03/16/2009 9:11:01 AM) Flu Vaccine Next Due:  1 yr Last PAP:  at gyn  (03/10/2008 1:51:17 PM) PAP Result Date:  03/10/2009 PAP Result:  negative PAP Next Due:  2 yr Last Bone Density:  normal (01/09/2007 1:51:47 PM) Bone Density Next Due: Refused DVE every 2 years..refuses more frequently due to medicare payment.  Refuses any ostoporosis med if she developed this, so feels no reason for DXA to look.

## 2010-07-10 NOTE — Assessment & Plan Note (Signed)
Summary: f/u dlo   Vital Signs:  Patient profile:   70 year old female Height:      65.75 inches Weight:      152.2 pounds BMI:     24.84 Temp:     98.3 degrees F oral Pulse rate:   72 / minute Pulse rhythm:   regular BP sitting:   124 / 80  (left arm) Cuff size:   regular  Vitals Entered By: Benny Lennert CMA Duncan Dull) (June 15, 2009 11:09 AM)  History of Present Illness: Chief complaint follow up toe pain(gout)  the patient was diagnosed with gout on November 09, 2008, and subsequently she had a flare was treated with colchicine and did well with this. Ultimately starting on April 28, 2009  At this point, this is non-crystal proven. Had a slightly elevated uric acid in 11/2008.  Trial of Indocin at that time and started to have some resolution, however, at that point saw Dr. Al Corpus and was placed on allopurinol. She has been in a gout flare since that time.  Saw Dr. Patsy Lager on 12/20.  Told to stop allopurinol because triggering flare.  Given colchicine..took 5 total, caused stomach upset. Only took diclofenac for 1 day.Treated also wth doxycycline course.   Rheum labs negative.   Restarted  cochicine 2 tabs daily up until 1/2 because felt not resolved completely. Pain in 4th digit now gone, still some redness an white nodule.   Problems Prior to Update: 1)  Unspecified Arthropathy Multiple Sites  (ICD-716.99) 2)  Toe Pain  (ICD-729.5) 3)  Special Screening Malig Neoplasms Other Sites  (ICD-V76.49) 4)  Hemoccult Positive Stool  (ICD-578.1) 5)  Gout, Unspecified  (ICD-274.9) 6)  Polycythemia  (ICD-289.0) 7)  Abnormal Chest Xray  (ICD-793.1) 8)  Tobacco Use, Quit  (ICD-V15.82) 9)  Special Screening Malig Neoplasms Other Sites  (ICD-V76.49) 10)  Hypercalcemia  (ICD-275.42) 11)  Lupus, Cutaneous  (ICD-710.0) 12)  Hypertension  (ICD-401.9) 13)  Hyperlipidemia  (ICD-272.4) 14)  Disorder, Calcium Metabolism Nos  (ICD-275.40) 15)  Herpes Labialis  (ICD-054.9) 16)   Hypothyroidism  (ICD-244.9)  Current Medications (verified): 1)  Synthroid 125 Mcg  Tabs (Levothyroxine Sodium) .... Take 1 Tablet By Mouth Once A Day 2)  Atenolol 25 Mg  Tabs (Atenolol) .... Take 1 Tablet By Mouth Once A Day 3)  Vitamin D 1000 Unit  Tabs (Cholecalciferol) .... Vitamin D-3 Take 1 Tablet By Mouth Two Times A Day 4)  Fish Oil   Oil (Fish Oil) .... Take 1 Tablet By Mouth Four Times A Day 5)  L-Lysine Hcl 500 Mg  Caps (Lysine Hcl) .... Take 1 Tablet By Mouth Once A Day 6)  Amlodipine Besylate 5 Mg Tabs (Amlodipine Besylate) .... Take 1 Tablet By Mouth Once A Day, If Tolerated May Increase To 10 Mg Daily 7)  Omeprazole 40 Mg Cpdr (Omeprazole) .... Take 1 By Mouth Once Daily 8)  Elocon 0.1 % Crea (Mometasone Furoate) .... Aaa Two Times A Day As Needed Rash 9)  Colchicine 0.6 Mg Tabs (Colchicine) .... Take 1 Tablet By Mouth Once A Day 10)  Diclofenac Sodium 75 Mg Tbec (Diclofenac Sodium) .Marland Kitchen.. 1 By Mouth Two Times A Day  Allergies: 1)  ! Sulfa 2)  ! Penicillin  Past History:  Past medical, surgical, family and social histories (including risk factors) reviewed, and no changes noted (except as noted below).  Past Medical History: Reviewed history from 04/06/2007 and no changes required. Hypothyroidism Hyperlipidemia Hypertension  Past Surgical History:  Reviewed history from 04/06/2007 and no changes required. Thyroid nodule, toxic: partial thyroidectomy DXA 8/08 nml tranvaginal [place]: small fibroid, small questionable cyst Tonsillectomy CXR 8/08 wnl  Family History: Reviewed history from 04/06/2007 and no changes required. father died age 45 arrythmia, poor circulation mother died age 63 DM, HTN, high chol, hyperthyroid brother: healthy MGM: CAD, MGF: flu uncle lung cancer  Social History: Reviewed history from 08/15/2008 and no changes required. Occupation: retired  Married 2 daughters, one lives in [place], one in [place] Former Smoker: >40 pack year  history Alcohol use-yes, socailly Drug use-no Regular exercise-no, used to walk Diet: fruit and veggies, water  Physical Exam  General:  Well-developed,well-nourished,in no acute distress; alert,appropriate and cooperative throughout examination Mouth:  MMM Lungs:  Normal respiratory effort, chest expands symmetrically. Lungs are clear to auscultation, no crackles or wheezes. Heart:  Normal rate and regular rhythm. S1 and S2 normal without gallop, murmur, click, rub or other extra sounds. Msk:  4th digit DIP joint with erythema has decreased, no tenderness unless firm squeeze of joint On the dorsum, there is a white area as well palpable and midly fluctuant.  Hands with diffuse heberden's nodes and severe CMC OA moderate synovitis in 1 DIP joint Hallux rigidis B and bunions Pulses:  R and L posterior tibial pulses are full and equal bilaterally  Extremities:  no edema   Impression & Recommendations:  Problem # 1:  GOUT, UNSPECIFIED (ICD-274.9) Almost resolved flare...will continue low dose colchicine. (Does not tolerate NSAIDs well) No sign of infection, s/p doxycycline. Recommended waiting longer before initiating back allopurinal.  Uric acid 4.2.  Her updated medication list for this problem includes:    Colchicine 0.6 Mg Tabs (Colchicine) .Marland Kitchen... Take 1 tablet by mouth once a day    Diclofenac Sodium 75 Mg Tbec (Diclofenac sodium) .Marland Kitchen... 1 by mouth two times a day  Complete Medication List: 1)  Synthroid 125 Mcg Tabs (Levothyroxine sodium) .... Take 1 tablet by mouth once a day 2)  Atenolol 25 Mg Tabs (Atenolol) .... Take 1 tablet by mouth once a day 3)  Vitamin D 1000 Unit Tabs (Cholecalciferol) .... Vitamin d-3 take 1 tablet by mouth two times a day 4)  Fish Oil Oil (Fish oil) .... Take 1 tablet by mouth four times a day 5)  L-lysine Hcl 500 Mg Caps (Lysine hcl) .... Take 1 tablet by mouth once a day 6)  Amlodipine Besylate 5 Mg Tabs (Amlodipine besylate) .... Take 1 tablet by  mouth once a day, if tolerated may increase to 10 mg daily 7)  Omeprazole 40 Mg Cpdr (Omeprazole) .... Take 1 by mouth once daily 8)  Elocon 0.1 % Crea (Mometasone furoate) .... Aaa two times a day as needed rash 9)  Colchicine 0.6 Mg Tabs (Colchicine) .... Take 1 tablet by mouth once a day 10)  Diclofenac Sodium 75 Mg Tbec (Diclofenac sodium) .Marland Kitchen.. 1 by mouth two times a day  Patient Instructions: 1)  Continue colchicine daily for about 2 weeks.Marland Kitchenonce no pain in toe, less redness, no warmth...then start allopurinol low dose 100 mg daily.  2)  Please schedule a follow-up appointment in 1 month.  Prescriptions: COLCHICINE 0.6 MG TABS (COLCHICINE) Take 1 tablet by mouth once a day  #14 x 0   Entered and Authorized by:   Kerby Nora MD   Signed by:   Kerby Nora MD on 06/15/2009   Method used:   Electronically to        [facility] [address] [phone]* (retail)       [address]       [place], [place]  [address]       Ph: [phone]       Fax: [phone]   RxID:   [id number]   Current Allergies (reviewed today): ! SULFA ! PENICILLIN

## 2010-07-10 NOTE — Assessment & Plan Note (Signed)
Summary: [name]...AS.   History of Present Illness Visit Type: follow up Primary GI MD: Elie Goody MD [facility] Primary Provider: Kerby Nora, MD Chief Complaint: epigastric pain, patient had an episode of vomiting black liquid in June 2011 History of Present Illness:   This is a 70 year old female, who has had recurrent problems with epigastric pain and intermittent nausea and vomiting. Hemoccult positive stool was noted last year, when she had an episode of epigastric pain, associated with vomiting dark material. She was not certain that the material was blood. She subsequently underwent colonoscopy for evaluation of Hemoccult-positive stool, which was unremarkable except for diverticulosis and hyperplastic polyps. Repeat Hemoccults in June 2011 were negative. She was prescribed omeprazole for several weeks, and her epigastric pain and nausea and vomiting abated. She discontinue omeprazole for several weeks and her symptoms returned.   GI Review of Systems    Reports abdominal pain, nausea, and  vomiting.     Location of  Abdominal pain: epigastric area.    Denies acid reflux, belching, bloating, chest pain, dysphagia with liquids, dysphagia with solids, heartburn, loss of appetite, vomiting blood, weight loss, and  weight gain.        Denies anal fissure, black tarry stools, change in bowel habit, constipation, diarrhea, diverticulosis, fecal incontinence, heme positive stool, hemorrhoids, irritable bowel syndrome, jaundice, light color stool, liver problems, rectal bleeding, and  rectal pain.   Current Medications (verified): 1)  Synthroid 125 Mcg  Tabs (Levothyroxine Sodium) .... Take 1 Tablet By Mouth Once A Day 2)  Atenolol 25 Mg  Tabs (Atenolol) .... Take 1 Tablet By Mouth Once A Day 3)  Vitamin D 1000 Unit  Tabs (Cholecalciferol) .... Vitamin D-3 Take 1 Tablet By Mouth Two Times A Day 4)  Fish Oil   Oil (Fish Oil) .... Take 1 Tablet By Mouth Four Times A Day 5)  L-Lysine  Hcl 500 Mg  Caps (Lysine Hcl) .... Take 1 Tablet By Mouth Once A Day 6)  Amlodipine Besylate 5 Mg Tabs (Amlodipine Besylate) .... Take 1 Tablet By Mouth Once A Day, If Tolerated May Increase To 10 Mg Daily 7)  Omeprazole 40 Mg Cpdr (Omeprazole) .... Take 1 By Mouth Once Daily 8)  Elocon 0.1 % Crea (Mometasone Furoate) .... Aaa Two Times A Day As Needed Rash 9)  Allopurinol 100 Mg Tabs (Allopurinol) .... Take 1 Tablet By Mouth Once A Day  Allergies (verified): 1)  ! Sulfa 2)  ! Penicillin  Past History:  Past Medical History: Hypothyroidism Hyperlipidemia Hypertension Diverticulosis Gout Adenomatous Colon Polyps 04/2005  Past Surgical History: Reviewed history from 04/06/2007 and no changes required. Thyroid nodule, toxic: partial thyroidectomy DXA 8/08 nml tranvaginal [place]: small fibroid, small questionable cyst Tonsillectomy CXR 8/08 wnl  Family History: Reviewed history from 04/06/2007 and no changes required. father died age 78 arrythmia, poor circulation mother died age 61 DM, HTN, high chol, hyperthyroid brother: healthy MGM: CAD, MGF: flu uncle lung cancer  Social History: Reviewed history from 08/15/2008 and no changes required. Occupation: retired  Married 2 daughters, one lives in [place], one in [place] Former Smoker: >40 pack year history Alcohol use-yes, socailly Drug use-no Regular exercise-no, used to walk Diet: fruit and veggies, water  Review of Systems  The patient denies allergy/sinus, anemia, anxiety-new, arthritis/joint pain, back pain, blood in urine, breast changes/lumps, change in vision, confusion, cough, coughing up blood, depression-new, fainting, fatigue, fever, headaches-new, hearing problems, heart murmur, heart rhythm changes, itching, menstrual pain, muscle pains/cramps, night  sweats, nosebleeds, pregnancy symptoms, shortness of breath, skin rash, sleeping problems, sore throat, swelling of feet/legs, swollen lymph glands, thirst -  excessive , urination - excessive , urination changes/pain, urine leakage, vision changes, and voice change.    Vital Signs:  Patient profile:   70 year old female Height:      65.75 inches Weight:      156.13 pounds BMI:     25.48 Pulse rate:   88 / minute Pulse rhythm:   regular BP sitting:   148 / 90  (left arm) Cuff size:   regular  Vitals Entered By: June McMurray CMA Duncan Dull) (March 29, 2010 11:08 AM)  Physical Exam  General:  Well developed, well nourished, no acute distress. Head:  Normocephalic and atraumatic. Eyes:  PERRLA, no icterus. Ears:  Normal auditory acuity. Mouth:  No deformity or lesions, dentition normal. Lungs:  Clear throughout to auscultation. Heart:  Regular rate and rhythm; no murmurs, rubs,  or bruits. Abdomen:  Soft, nontender and nondistended. No masses, hepatosplenomegaly or hernias noted. Normal bowel sounds. Psych:  easily distracted and poor concentration.    Impression & Recommendations:  Problem # 1:  EPIGASTRIC PAIN (ICD-789.06) Intermittent epigastric pain, associated with nausea, vomiting, and a history of Hemoccult-positive stool in 2010 and her symptoms appeared to respond to a course of omeprazole. Rule out ulcer disease, gastritis, and GERD. The risks, benefits and alternatives to endoscopy with possible biopsy and possible dilation were discussed with the patient and they consent to proceed. The procedure will be scheduled electively. B. Upper endoscopy is nondiagnostic proceed with abdominal ultrasound imaging to rule out cholelithiasis. Orders: EGD (EGD)  Problem # 2:  NONSPECIFIC ABNORMAL FINDING IN STOOL CONTENTS (ICD-792.1) As in problem #1. Orders: EGD (EGD)  Problem # 3:  PERSONAL HX COLONIC POLYPS (ICD-V12.72) Prior history of colon polyps diagnosed in [place] in 2006. Patient reports precancerous polyps. Attempt to obtain records. strands colonoscopy currently scheduled for June 2015.  Patient Instructions: 1)  Upper  Endoscopy brochure given.  2)  Copy sent to : Kerby Nora, MD 3)  The medication list was reviewed and reconciled.  All changed / newly prescribed medications were explained.  A complete medication list was provided to the patient / caregiver.

## 2010-07-10 NOTE — Progress Notes (Signed)
Summary: wants referral to GI  Phone Note Call from Patient Call back at Home Phone [phone]   Caller: Patient Call For: Kerby Nora MD Summary of Call: Patient is having stomach problems. Patient talked w/ dr. Ermalene Searing in May about this. Patient is asking if she could get a referral to GI. Patient prefers [place]. Patient saw Dr. Russella Dar for her colonoscpoy, she called to set up appt there, but can't be seen until October and she wants to be seen before then. Pleaswe advise. Initial call taken by: Melody Comas,  January 29, 2010 1:28 PM  Follow-up for Phone Call        Dr. Daphine Deutscher notes reviewed. This was in POC Follow-up by: Hannah Beat MD,  January 29, 2010 1:38 PM

## 2010-07-10 NOTE — Letter (Signed)
Summary: New Patient letter  [facility]  [address] [place], [place] [address]   Phone: [phone]  Fax: [phone]       01/29/2010 MRN: [id number]  [facility] [address] [place], [place]  [address]  Dear Ms. Guadamuz,  Welcome to the Gastroenterology Division at [facility].    You are scheduled to see Dr. Russella Dar on 03/29/2010 at 11:00AM on the 3rd floor at [facility], [address]  We ask that you try to arrive at our office 15 minutes prior to your appointment time to allow for check-in.  We would like you to complete the enclosed self-administered evaluation form prior to your visit and bring it with you on the day of your appointment.  We will review it with you.  Also, please bring a complete list of all your medications or, if you prefer, bring the medication bottles and we will list them.  Please bring your insurance card so that we may make a copy of it.  If your insurance requires a referral to see a specialist, please bring your referral form from your primary care physician.  Co-payments are due at the time of your visit and may be paid by cash, check or credit card.     Your office visit will consist of a consult with your physician (includes a physical exam), any laboratory testing he/she may order, scheduling of any necessary diagnostic testing (e.g. x-ray, ultrasound, CT-scan), and scheduling of a procedure (e.g. Endoscopy, Colonoscopy) if required.  Please allow enough time on your schedule to allow for any/all of these possibilities.    If you cannot keep your appointment, please call [phone] to cancel or reschedule prior to your appointment date.  This allows [place] the opportunity to schedule an appointment for another patient in need of care.  If you do not cancel or reschedule by 5 p.m. the business day prior to your appointment date, you will be charged a $50.00 late cancellation/no-show fee.    Thank you for  choosing [facility] for your medical needs.  We appreciate the opportunity to care for you.  Please visit [place] at our website  to learn more about our practice.                     Sincerely,                                                             The Gastroenterology Division

## 2010-07-10 NOTE — Progress Notes (Signed)
Summary: Colchicine  Phone Note Call from Patient Call back at Home Phone [phone]   Caller: Patient Call For: Kerby Nora MD Summary of Call: The medication that she was given is not available in generic now and is only availabe as brand name.  It was very expensive.   Could she get something else instead?   Uses [facility] Initial call taken by: Delilah Shan CMA Duncan Dull),  June 16, 2009 12:28 PM  Follow-up for Phone Call        Given her past intolerance to NSIADS.Marland Kitchencolchicine is the way to go. No other direct equivalent.  She can use the diclofenac given to her by [place] two times a day instead, but she had said this hurt stomach. If takes, use with food and make sure taking omeprazole 40 mg daily.  Follow-up by: Kerby Nora MD,  June 16, 2009 12:54 PM  Additional Follow-up for Phone Call Additional follow up Details #1::        Patient notified as instructed. Additional Follow-up by: Linde Gillis CMA Duncan Dull),  June 16, 2009 2:16 PM

## 2010-07-10 NOTE — Procedures (Signed)
Summary: Upper Endoscopy  Patient: Gloria Lloyd Note: All result statuses are Final unless otherwise noted.  Tests: (1) Upper Endoscopy (EGD)   EGD Upper Endoscopy       DONE     [facility]     [address]     [place], [place]  [address]           ENDOSCOPY PROCEDURE REPORT     PATIENT:  Gloria Lloyd, Gloria Lloyd  MR#:  [id number]     BIRTHDATE:  [birth year], 69 yrs. old  GENDER:  female     ENDOSCOPIST:  Judie Petit T. Russella Dar, MD, [facility]           PROCEDURE DATE:  04/02/2010     PROCEDURE:  EGD, diagnostic 43235     ASA CLASS:  Class II     INDICATIONS:  epigastric pain, hemoccult positive stool     MEDICATIONS:  Fentanyl 50 mcg IV, Versed 5 mg IV     TOPICAL ANESTHETIC:  Exactacain Spray     DESCRIPTION OF PROCEDURE:   After the risks benefits and     alternatives of the procedure were thoroughly explained, informed     consent was obtained.  The LB [id number] endoscope was     introduced through the mouth and advanced to the second portion of     the duodenum, without limitations.  The instrument was slowly     withdrawn as the mucosa was fully examined.     <<PROCEDUREIMAGES>>     Esophagitis was found in the distal esophagus. It was erosive. LA     Classification Grade B. The stomach was entered and closely     examined. The pylorus, antrum, angularis, and lesser curvature     were well visualized, including a retroflexed view of the cardia     and fundus. The stomach wall was normally distensable. The scope     passed easily through the pylorus into the duodenum. The duodenal     bulb was normal in appearance, as was the postbulbar duodenum.     Retroflexed views revealed a hiatal hernia, small. The scope was     then withdrawn from the patient and the procedure completed.           COMPLICATIONS:  None           ENDOSCOPIC IMPRESSION:     1) Erosive esophagitis     2) Small hiatal hernia           RECOMMENDATIONS:     1) Anti-reflux regimen long term     2)  PPI qam long term for EE     3) follow-up with PCP, GI F/U prn           Dominico Rod T. Russella Dar, MD, [facility]           CC:  Amy Michelle Nasuti, MD           n.     Rosalie DoctorVenita Lick. Carely Nappier at 04/02/2010 03:01 PM           [place], [place], [id number]  Note: An exclamation mark (!) indicates a result that was not dispersed into the flowsheet. Document Creation Date: 04/02/2010 3:01 PM _______________________________________________________________________  (1) Order result status: Final Collection or observation date-time: 04/02/2010 14:52 Requested date-time:  Receipt date-time:  Reported date-time:  Referring Physician:   Ordering Physician: Claudette Head [id number]) Specimen Source:  Source: Gloria Lloyd Order Number: [id number] Lab site:

## 2010-07-12 NOTE — Progress Notes (Signed)
Summary: prior auth denied for elocon  Phone Note From Pharmacy   [facility] Summary of Call: Prior auth denied for elocon.  Pt has to have tried and failed 2 generics.  Advised pharmacy.                   Lowella Petties CMA, AAMA  May 28, 2010 4:23 PM   Follow-up for Phone Call        call in generic triamcinalone ointment, 0.1%, 60 grams, apply two times a day  0 refills  Hannah Beat MD  May 28, 2010 4:39 PM   Additional Follow-up for Phone Call Additional follow up Details #1::        PATIENT ADVISED AND SHE IS JUST GOING TO GET PHARMACY TO DO GENERIC ON THE SCRIPT.Consuello Masse CMA   Additional Follow-up by: Benny Lennert CMA Duncan Dull),  May 29, 2010 8:08 AM

## 2010-07-12 NOTE — Assessment & Plan Note (Signed)
Summary: 6 month follow up /rbh   Vital Signs:  Patient profile:   70 year old female Height:      65.75 inches Weight:      155.4 pounds BMI:     25.36 Temp:     97.9 degrees F oral Pulse rate:   84 / minute Pulse rhythm:   regular BP sitting:   100 / 60  (left arm) Cuff size:   regular  Vitals Entered By: Benny Lennert CMA Duncan Dull) (May 18, 2010 12:06 PM)  History of Present Illness: Chief complaint 6 month follow up   Gout: On allopurinol. No further gout flares. Joint enlargement remains but not swollen.  Vaginal irritation: on premarin cream... had some difficulty using but helped when using. Symptoms now resolved.  Too expensive for her to use.   Hypothyroid: stable TSH: 1.47 (04/30/2010 1:13:26 PM)   Hypercalcemia: stable of HCTZ.   Back pain x 5 months... no falls. Noted after lifting item.  No numbness in legs, no weakness.  Using tylenol, stretching , helping some.    Hypertension History:      She denies headache, chest pain, palpitations, dyspnea with exertion, orthopnea, and peripheral edema.  She notes no problems with any antihypertensive medication side effects.  Well controlled at home. Marland Kitchen        Positive major cardiovascular risk factors include female age 98 years old or older, hyperlipidemia, and hypertension.  Negative major cardiovascular risk factors include non-tobacco-user status.    Lipid Management History:      Positive NCEP/ATP III risk factors include female age 53 years old or older and hypertension.  Negative NCEP/ATP III risk factors include HDL cholesterol greater than 60 and non-tobacco-user status.        Comments: Slight improvement  Has not been taking fish oil.   Refuses to take prescription med for cholesterol. .    Problems Prior to Update: 1)  Vaginitis, Atrophic  (ICD-627.3) 2)  Urinary Frequency  (ICD-788.41) 3)  Personal Hx Colonic Polyps  (ICD-V12.72) 4)  Gerd  (ICD-530.81) 5)  Nonspecific Abnormal Finding in Stool  Contents  (ICD-792.1) 6)  Epigastric Pain  (ICD-789.06) 7)  Unspecified Arthropathy Multiple Sites  (ICD-716.99) 8)  Toe Pain  (ICD-729.5) 9)  Special Screening Malig Neoplasms Other Sites  (ICD-V76.49) 10)  Hemoccult Positive Stool  (ICD-578.1) 11)  Gout, Unspecified  (ICD-274.9) 12)  Polycythemia  (ICD-289.0) 13)  Abnormal Chest Xray  (ICD-793.1) 14)  Tobacco Use, Quit  (ICD-V15.82) 15)  Special Screening Malig Neoplasms Other Sites  (ICD-V76.49) 16)  Hypercalcemia  (ICD-275.42) 17)  Lupus, Cutaneous  (ICD-710.0) 18)  Hypertension  (ICD-401.9) 19)  Hyperlipidemia  (ICD-272.4) 20)  Disorder, Calcium Metabolism Nos  (ICD-275.40) 21)  Herpes Labialis  (ICD-054.9) 22)  Hypothyroidism  (ICD-244.9)  Current Medications (verified): 1)  Synthroid 125 Mcg  Tabs (Levothyroxine Sodium) .... Take 1 Tablet By Mouth Once A Day 2)  Atenolol 25 Mg  Tabs (Atenolol) .... Take 1 Tablet By Mouth Once A Day 3)  Vitamin D 1000 Unit  Tabs (Cholecalciferol) .... Vitamin D-3 Take 1 Tablet By Mouth Two Times A Day 4)  L-Lysine Hcl 500 Mg  Caps (Lysine Hcl) .... Take 1 Tablet By Mouth Once A Day 5)  Amlodipine Besylate 5 Mg Tabs (Amlodipine Besylate) .... Take 1 Tablet By Mouth Once A Day, If Tolerated May Increase To 10 Mg Daily 6)  Omeprazole 40 Mg Cpdr (Omeprazole) .... Take 1 By Mouth Once Daily 7)  Elocon  0.1 % Crea (Mometasone Furoate) .... Aaa Two Times A Day As Needed Rash 8)  Allopurinol 100 Mg Tabs (Allopurinol) .... Take 1 Tablet By Mouth Once A Day 9)  Premarin 0.625 Mg/gm Crea (Estrogens, Conjugated) .... 2 Gm Per Vaginally Daily X 1 Week Then Decrease To 2 G Per Vag 1-3 Times Per Week..as Symptoms Allow  Allergies: 1)  ! Sulfa 2)  ! Penicillin  Past History:  Past medical, surgical, family and social histories (including risk factors) reviewed, and no changes noted (except as noted below).  Past Medical History: Reviewed history from 03/29/2010 and no changes  required. Hypothyroidism Hyperlipidemia Hypertension Diverticulosis Gout Adenomatous Colon Polyps 04/2005  Past Surgical History: Reviewed history from 04/06/2007 and no changes required. Thyroid nodule, toxic: partial thyroidectomy DXA 8/08 nml tranvaginal [place]: small fibroid, small questionable cyst Tonsillectomy CXR 8/08 wnl  Family History: Reviewed history from 04/06/2007 and no changes required. father died age 69 arrythmia, poor circulation mother died age 52 DM, HTN, high chol, hyperthyroid brother: healthy MGM: CAD, MGF: flu uncle lung cancer  Social History: Reviewed history from 08/15/2008 and no changes required. Occupation: retired  Married 2 daughters, one lives in [place], one in [place] Former Smoker: >40 pack year history Alcohol use-yes, socailly Drug use-no Regular exercise-no, used to walk Diet: fruit and veggies, water  Review of Systems General:  Denies fatigue and fever. CV:  Denies chest pain or discomfort and fainting; lightheaded.Marland Kitchen Resp:  Denies shortness of breath. GI:  Denies abdominal pain. GU:  Denies dysuria.  Physical Exam  General:  Well-developed,well-nourished,in no acute distress; alert,appropriate and cooperative throughout examination Mouth:  Oral mucosa and oropharynx without lesions or exudates.  Teeth in good repair. Neck:  no carotid bruit or thyromegaly no cervical or supraclavicular lymphadenopathy  Lungs:  Normal respiratory effort, chest expands symmetrically. Lungs are clear to auscultation, no crackles or wheezes. Heart:  Normal rate and regular rhythm. S1 and S2 normal without gallop, murmur, click, rub or other extra sounds. Abdomen:  Bowel sounds positive,abdomen soft and non-tender without masses, organomegaly or hernias noted. Msk:  No deformity or scoliosis noted of thoracic or lumbar spine.    Neg SLR, neg Faber's   No focal ttp Pulses:  R and L posterior tibial pulses are full and equal bilaterally   Extremities:  no edema Neurologic:  No cranial nerve deficits noted. Station and gait are normal. Plantar reflexes are down-going bilaterally. DTRs are symmetrical throughout. Sensory, motor and coordinative functions appear intact. Skin:  Intact without suspicious lesions or rashes Psych:  Cognition and judgment appear intact. Alert and cooperative with normal attention span and concentration. No apparent delusions, illusions, hallucinations   Impression & Recommendations:  Problem # 1:  GOUT, UNSPECIFIED (ICD-274.9) On allopurinol. No further gout flares. Joint enlargement remains but not swollen. Her updated medication list for this problem includes:    Allopurinol 100 Mg Tabs (Allopurinol) .Marland Kitchen... Take 1 tablet by mouth once a day  Problem # 2:  VAGINITIS, ATROPHIC (ICD-627.3) Assessment: Improved  Her updated medication list for this problem includes:    Premarin 0.625 Mg/gm Crea (Estrogens, conjugated) .Marland Kitchen... 2 gm per vaginally daily x 1 week then decrease to 2 g per vag 1-3 times per week.Marland Kitchenas symptoms allow  Problem # 3:  HYPERTENSION (ICD-401.9) Well controlled. Continue current medication. Follow BP at home.. call if running <90/60  or if lightheaded with lower numbers.  Calcium stable off HCTZ. Her updated medication list for this problem includes:    Atenolol  25 Mg Tabs (Atenolol) .Marland Kitchen... Take 1 tablet by mouth once a day    Amlodipine Besylate 5 Mg Tabs (Amlodipine besylate) .Marland Kitchen... Take 1 tablet by mouth once a day, if tolerated may increase to 10 mg daily  Problem # 4:  HYPOTHYROIDISM (ICD-244.9) Stable. Her updated medication list for this problem includes:    Synthroid 125 Mcg Tabs (Levothyroxine sodium) .Marland Kitchen... Take 1 tablet by mouth once a day  Problem # 5:  BACK PAIN (ICD-724.5) Miniaml. Pt does not wish t treat at this time. REcommended NSAIDS, heat and gentle stretching exercsies.   Complete Medication List: 1)  Synthroid 125 Mcg Tabs (Levothyroxine sodium) .... Take  1 tablet by mouth once a day 2)  Atenolol 25 Mg Tabs (Atenolol) .... Take 1 tablet by mouth once a day 3)  Vitamin D 1000 Unit Tabs (Cholecalciferol) .... Vitamin d-3 take 1 tablet by mouth two times a day 4)  L-lysine Hcl 500 Mg Caps (Lysine hcl) .... Take 1 tablet by mouth once a day 5)  Amlodipine Besylate 5 Mg Tabs (Amlodipine besylate) .... Take 1 tablet by mouth once a day, if tolerated may increase to 10 mg daily 6)  Omeprazole 40 Mg Cpdr (Omeprazole) .... Take 1 by mouth once daily 7)  Elocon 0.1 % Crea (Mometasone furoate) .... Aaa two times a day as needed rash 8)  Allopurinol 100 Mg Tabs (Allopurinol) .... Take 1 tablet by mouth once a day 9)  Premarin 0.625 Mg/gm Crea (Estrogens, conjugated) .... 2 gm per vaginally daily x 1 week then decrease to 2 g per vag 1-3 times per week.Marland Kitchenas symptoms allow  Hypertension Assessment/Plan:      The patient's hypertensive risk group is category B: At least one risk factor (excluding diabetes) with no target organ damage.  Today's blood pressure is 100/60.  Her blood pressure goal is < 140/90.  Lipid Assessment/Plan:      Based on NCEP/ATP III, the patient's risk factor category is "0-1 risk factors".  The patient's lipid goals are as follows: Total cholesterol goal is 200; LDL cholesterol goal is 160; HDL cholesterol goal is 40; Triglyceride goal is 150.  Her LDL cholesterol goal has not been met.    Patient Instructions: 1)  Follow BP at home.. call if running <90/60  or if lightheaded with lower numbers.  2)  Schedule CPX in next 6 month. 3)  BMP prior to visit, ICD-9:  4)  Hepatic Panel prior to visit ICD-9:  5)  Lipid panel prior to visit ICD-9 :  6)   Uric acid Dx 274.9   Orders Added: 1)  Est. Patient Level IV [84696]    Current Allergies (reviewed today): ! SULFA ! PENICILLIN

## 2010-07-12 NOTE — Progress Notes (Signed)
Summary: prior auth needed for elocon  Phone Note From Pharmacy   Caller: [facility] [address] [id number] Summary of Call: Prior Berkley Harvey is needed for elocon cream , form is on your desk. Initial call taken by: Lowella Petties CMA, AAMA,  May 22, 2010 2:58 PM

## 2010-09-11 ENCOUNTER — Other Ambulatory Visit: Payer: Self-pay | Admitting: Family Medicine

## 2010-10-23 NOTE — H&P (Signed)
Gloria Lloyd, Gloria Lloyd             ACCOUNT NO.:  [id number]   MEDICAL RECORD NO.:  [id number]          PATIENT TYPE:  AMB   LOCATION:  SDC                           FACILITY:  [facility]   PHYSICIAN:  Huel Cote, M.D. DATE OF BIRTH:  April 05, 1941   DATE OF ADMISSION:  07/21/2007  DATE OF DISCHARGE:                              HISTORY & PHYSICAL   Surgery to take place at the [facility] facility on July 21, 2007, at 9:15 a.m.   The patient is a 70 year old G3, P2 who is coming in for a scheduled  hysteroscopy, possible polypectomy and endometrial sampling given  ongoing problem with postmenopausal bleeding.  The patient first  reported this problem in December 2008 and had noticed some intermittent  brown bleeding.  For this she had an ultrasound performed and  endometrial stripe was noted to be approximately 6-7 mm which is  slightly thicker than expected.  The patient was at this point offered  an endometrial biopsy versus a hysteroscopy and endometrial sampling and  she chose to go forward with the hysteroscopy and endometrial sampling  because there was some suggestion of a polyp on her ultrasound.  She  also has continued to have further intermittent bleeding which is  annoying in nature and certainly troubling to her.   PAST MEDICAL HISTORY:  Significant for chronic hypertension,  hypothyroidism, hypercholesterolemia, skin lupus, and borderline  hyperparathyroidism.  Surgically, she has had a partial thyroidectomy.   ALLERGIES:  1. PENICILLIN.  2. SULFA.   GYNECOLOGIC HISTORY:  She has had no abnormal Pap smears.   FAMILY HISTORY:  Breast cancer in a cousin.  Colon cancer - none.   OBSTETRICAL HISTORY:  Significant for two vaginal deliveries and one  miscarriage with a D&C.   She is up-to-date on her health maintenance with recent colonoscopy,  mammogram and bone density scans.   PHYSICAL EXAMINATION:  She is 155 pounds.  Blood pressure is 130/80.  CARDIAC  EXAM:  Regular rate and rhythm.  LUNGS:  Clear.  ABDOMEN:  Soft and nontender.  CERVIX:  Has no lesions, very atrophic vaginally.  Uterus is upper  limits of normal.  Adnexa has no masses.   The patient was counseled as the risks and benefits of surgery including  uterine perforation and bleeding.  We will try to minimize this risk by  having her place a Cytotec tablet approximately 3-4 hours prior to  procedure.  This was given to her and will hopefully help with any  cervical stenosis or atrophy.  We will proceed with a diagnostic  hysteroscopy and polypectomy should there be a polyp present, as well as  endometrial sampling to rule out any significant pathology.  The patient  understands the risks and benefits of the procedure and desires to  proceed as stated.      Huel Cote, M.D.  Electronically Signed     KR/MEDQ  D:  07/17/2007  T:  07/19/2007  Job:  [id number]

## 2010-10-23 NOTE — Op Note (Signed)
Gloria Lloyd, Gloria Lloyd             ACCOUNT NO.:  [id number]   MEDICAL RECORD NO.:  [id number]          PATIENT TYPE:  AMB   LOCATION:  SDC                           FACILITY:  [facility]   PHYSICIAN:  Huel Cote, M.D. DATE OF BIRTH:  [birth year]   DATE OF PROCEDURE:  07/21/2007  DATE OF DISCHARGE:                               OPERATIVE REPORT   PREOPERATIVE DIAGNOSIS:  Postmenopausal bleeding.   POSTOPERATIVE DIAGNOSIS:  Endometrial polyps.   PROCEDURE:  Hysteroscopy and polypectomy   SURGEON:  Dr. Huel Cote.   ANESTHESIA:  MAC with 1% paracervical block with lidocaine.   FINDINGS:  Two endometrial polyps and two small submucosal fibroids.   SPECIMENS:  The polyps and curettings were sent to pathology.   ESTIMATED BLOOD LOSS:  Minimal.   No complications.   PROCEDURE:  The patient was taken to the operating room where MAC  anesthesia was obtained without difficulty.  She was then prepped and  draped in normal sterile fashion in the dorsal lithotomy position.  The  speculum was placed in the cervix with the bladder drained and the  cervix grasped on the anterior lip with a single-tooth tenaculum was  then injected with 10 mL at 2 o'clock and 10 mL at 10 o'clock for a  paracervical block.  The lacrimal dilator was required to dilate the  external os which was quite stenotic and then the flexible plastic  Cooper dilator was utilized to enter the fundus.  Once this was dilated  and the sound easily passed and the uterus sounded to approximately 7-8  cm, the cervix was then sequentially dilated up to approximately 20, 21.  This small resectoscope was then placed within the cervix and the  endometrial cavity inspected with the polyps discovered as noted.  There  were approximately two to three polyps present and these were all on  pretty small stalks.  At this point the scope was removed and the polyp  forceps introduced into the fundus.  In several passes the bulk of the  polyp were removed and handed off to pathology.  Hysteroscope was  reintroduced and there was a portion of one polyps still adherent. The  flexible grasper was then placed through the port on the hysteroscope  and the remaining portion of polyp grasped at its base and then removed  in its entirety.  At this point there was really no further polyp noted.  There was a submucosal fibroid in the anterior left cornual region which  was small, less than 1 cm.  An additional small submucosal fibroid in  the lower uterine segment on the right.  These were both less than 1 cm  and as the patient had an atrophic endometrial lining, it was felt that  it was not necessary to resect these at the time.  Once this was  accomplished, the hysteroscope was removed and endometrial sampling  performed with endometrial curettage;  however very minimal tissue was obtained as the remaining lining was  quite atrophic and at this point, the patient was awakened and taken to  the recovery room in stable condition after  the speculum and tenaculum  were removed from the cervix.  There was no active bleeding noted and  the patient tolerated the procedure quite well.      Huel Cote, M.D.  Electronically Signed     KR/MEDQ  D:  07/21/2007  T:  07/22/2007  Job:  [id number]

## 2010-11-04 ENCOUNTER — Telehealth: Payer: Self-pay | Admitting: Family Medicine

## 2010-11-04 DIAGNOSIS — E7800 Pure hypercholesterolemia, unspecified: Secondary | ICD-10-CM

## 2010-11-04 NOTE — Telephone Encounter (Signed)
Message copied by Excell Seltzer on Sun Nov 04, 2010 10:48 PM ------      Message from: Baldomero Lamy      Created: Wed Oct 31, 2010  7:34 AM      Regarding: Cpx labs tues       Please order  future cpx labs for pt's upcomming lab appt.      Thanks      Rodney Booze

## 2010-11-06 ENCOUNTER — Other Ambulatory Visit (INDEPENDENT_AMBULATORY_CARE_PROVIDER_SITE_OTHER): Payer: Medicare Other | Admitting: Family Medicine

## 2010-11-06 DIAGNOSIS — M109 Gout, unspecified: Secondary | ICD-10-CM

## 2010-11-06 DIAGNOSIS — E7800 Pure hypercholesterolemia, unspecified: Secondary | ICD-10-CM

## 2010-11-06 LAB — LIPID PANEL
Cholesterol: 243 mg/dL — ABNORMAL HIGH (ref 0–200)
Triglycerides: 110 mg/dL (ref 0.0–149.0)

## 2010-11-06 LAB — COMPREHENSIVE METABOLIC PANEL
CO2: 30 mEq/L (ref 19–32)
Calcium: 9.9 mg/dL (ref 8.4–10.5)
Chloride: 107 mEq/L (ref 96–112)
Creatinine, Ser: 0.8 mg/dL (ref 0.4–1.2)
GFR: 73 mL/min (ref >60.00)
Glucose, Bld: 94 mg/dL (ref 70–99)
Sodium: 143 mEq/L (ref 135–145)
Total Bilirubin: 0.6 mg/dL (ref 0.3–1.2)
Total Protein: 7.2 g/dL (ref 6.0–8.3)

## 2010-11-06 LAB — URIC ACID: Uric Acid, Serum: 5.8 mg/dL (ref 2.4–7.0)

## 2010-11-08 ENCOUNTER — Encounter: Payer: Self-pay | Admitting: Family Medicine

## 2010-11-13 ENCOUNTER — Ambulatory Visit (INDEPENDENT_AMBULATORY_CARE_PROVIDER_SITE_OTHER): Payer: Medicare Other | Admitting: Family Medicine

## 2010-11-13 ENCOUNTER — Encounter: Payer: Self-pay | Admitting: Family Medicine

## 2010-11-13 DIAGNOSIS — E039 Hypothyroidism, unspecified: Secondary | ICD-10-CM

## 2010-11-13 DIAGNOSIS — K219 Gastro-esophageal reflux disease without esophagitis: Secondary | ICD-10-CM

## 2010-11-13 DIAGNOSIS — E785 Hyperlipidemia, unspecified: Secondary | ICD-10-CM

## 2010-11-13 DIAGNOSIS — M109 Gout, unspecified: Secondary | ICD-10-CM

## 2010-11-13 DIAGNOSIS — R5383 Other fatigue: Secondary | ICD-10-CM

## 2010-11-13 DIAGNOSIS — I10 Essential (primary) hypertension: Secondary | ICD-10-CM

## 2010-11-13 MED ORDER — AMLODIPINE BESYLATE 5 MG PO TABS: 5.0000 mg | ORAL_TABLET | Freq: Every day | ORAL | Status: DC

## 2010-11-13 MED ORDER — ALLOPURINOL 100 MG PO TABS: 100.0000 mg | ORAL_TABLET | Freq: Every day | ORAL | Status: DC

## 2010-11-13 MED ORDER — ATENOLOL 25 MG PO TABS: 25.0000 mg | ORAL_TABLET | Freq: Every day | ORAL | Status: DC

## 2010-11-13 NOTE — Assessment & Plan Note (Signed)
Erosive esophagitis noted on EGD. I do not recommend weaning off PPI.

## 2010-11-13 NOTE — Assessment & Plan Note (Addendum)
Poor control, refuses statin,natural med or other prescription chol med,  understands risk of not treating.

## 2010-11-13 NOTE — Progress Notes (Signed)
  Subjective:    Patient ID: Gloria Lloyd, female    DOB: June 15, 1940, 70 y.o.   MRN: [id number]  HPI She does not want to do appt for physical today. She did not realize she was scheduled for this.  Hypertension:    Using medication without problems or lightheadedness: well controlled  Chest pain with exertion:None Edema:DID have some with recent bus trip to [facility] Short of breath:NONE Average home BPs: good Other issues:  Poor control cholesterol: refuses medication due to concern of SE. Working on exercise and diet change.  High calcium, well controlled  Review of Systems  Constitutional: Negative for fever and fatigue.  HENT: Negative for ear pain.   Eyes: Negative for pain.  Respiratory: Negative for chest tightness and shortness of breath.   Cardiovascular: Negative for chest pain, palpitations and leg swelling.  Gastrointestinal: Negative for abdominal pain.  Genitourinary: Negative for dysuria.       Objective:   Physical Exam  Constitutional: Vital signs are normal. She appears well-developed and well-nourished. She is cooperative.  Non-toxic appearance. She does not appear ill. No distress.  HENT:  Head: Normocephalic.  Right Ear: Hearing, tympanic membrane, external ear and ear canal normal. Tympanic membrane is not erythematous, not retracted and not bulging.  Left Ear: Hearing, tympanic membrane, external ear and ear canal normal. Tympanic membrane is not erythematous, not retracted and not bulging.  Nose: No mucosal edema or rhinorrhea. Right sinus exhibits no maxillary sinus tenderness and no frontal sinus tenderness. Left sinus exhibits no maxillary sinus tenderness and no frontal sinus tenderness.  Mouth/Throat: Uvula is midline, oropharynx is clear and moist and mucous membranes are normal.  Eyes: Conjunctivae, EOM and lids are normal. Pupils are equal, round, and reactive to light. No foreign bodies found.  Neck: Trachea normal and normal range of motion. Neck  supple. Carotid bruit is not present. No mass and no thyromegaly present.  Cardiovascular: Normal rate, regular rhythm, S1 normal, S2 normal, normal heart sounds, intact distal pulses and normal pulses.  Exam reveals no gallop and no friction rub.   No murmur heard. Pulmonary/Chest: Effort normal and breath sounds normal. Not tachypneic. No respiratory distress. She has no decreased breath sounds. She has no wheezes. She has no rhonchi. She has no rales.  Abdominal: Soft. Normal appearance and bowel sounds are normal. There is no tenderness.  Neurological: She is alert.  Skin: Skin is warm, dry and intact. No rash noted.  Psychiatric: Her speech is normal and behavior is normal. Judgment and thought content normal. Her mood appears not anxious. Cognition and memory are normal. She does not exhibit a depressed mood.          Assessment & Plan:

## 2010-11-13 NOTE — Assessment & Plan Note (Signed)
No flares in last 6 months, low uric acid. Pt interested in decreasing allopurinol. Will do trial.

## 2010-11-13 NOTE — Assessment & Plan Note (Signed)
Well controlled. Continue current medication.  

## 2010-11-13 NOTE — Assessment & Plan Note (Signed)
Due for re-eval next OV. 

## 2010-12-07 ENCOUNTER — Other Ambulatory Visit: Payer: Self-pay | Admitting: Family Medicine

## 2011-01-28 ENCOUNTER — Other Ambulatory Visit (INDEPENDENT_AMBULATORY_CARE_PROVIDER_SITE_OTHER): Payer: Medicare Other | Admitting: Family Medicine

## 2011-01-28 DIAGNOSIS — M109 Gout, unspecified: Secondary | ICD-10-CM

## 2011-01-28 DIAGNOSIS — R5381 Other malaise: Secondary | ICD-10-CM

## 2011-01-28 DIAGNOSIS — R5383 Other fatigue: Secondary | ICD-10-CM

## 2011-01-28 DIAGNOSIS — I10 Essential (primary) hypertension: Secondary | ICD-10-CM

## 2011-01-28 LAB — CBC WITH DIFFERENTIAL/PLATELET
Basophils Absolute: 0 10*3/uL (ref 0.0–0.1)
Basophils Relative: 0.3 % (ref 0.0–3.0)
Eosinophils Relative: 1.7 % (ref 0.0–5.0)
HCT: 49.6 % — ABNORMAL HIGH (ref 36.0–46.0)
Hemoglobin: 16.4 g/dL — ABNORMAL HIGH (ref 12.0–15.0)
Lymphocytes Relative: 17.5 % (ref 12.0–46.0)
Lymphs Abs: 1.7 10*3/uL (ref 0.7–4.0)
Monocytes Relative: 4.1 % (ref 3.0–12.0)
Neutro Abs: 7.3 10*3/uL (ref 1.4–7.7)
RBC: 5.33 Mil/uL — ABNORMAL HIGH (ref 3.87–5.11)
WBC: 9.5 10*3/uL (ref 4.5–10.5)

## 2011-01-28 LAB — COMPREHENSIVE METABOLIC PANEL
ALT: 16 U/L (ref 0–35)
BUN: 12 mg/dL (ref 6–23)
CO2: 27 mEq/L (ref 19–32)
Calcium: 10 mg/dL (ref 8.4–10.5)
Chloride: 103 mEq/L (ref 96–112)
Creatinine, Ser: 1 mg/dL (ref 0.4–1.2)
GFR: 60.1 mL/min (ref >60.00)
Glucose, Bld: 109 mg/dL — ABNORMAL HIGH (ref 70–99)
Total Bilirubin: 1.1 mg/dL (ref 0.3–1.2)

## 2011-01-28 LAB — URIC ACID: Uric Acid, Serum: 6.1 mg/dL (ref 2.4–7.0)

## 2011-01-31 ENCOUNTER — Encounter: Payer: Self-pay | Admitting: Family Medicine

## 2011-01-31 ENCOUNTER — Ambulatory Visit (INDEPENDENT_AMBULATORY_CARE_PROVIDER_SITE_OTHER): Payer: Medicare Other | Admitting: Family Medicine

## 2011-01-31 VITALS — BP 130/90 | HR 99 | Temp 98.6°F | Ht 66.25 in | Wt 155.0 lb

## 2011-01-31 DIAGNOSIS — Z780 Asymptomatic menopausal state: Secondary | ICD-10-CM

## 2011-01-31 DIAGNOSIS — E039 Hypothyroidism, unspecified: Secondary | ICD-10-CM

## 2011-01-31 DIAGNOSIS — I10 Essential (primary) hypertension: Secondary | ICD-10-CM

## 2011-01-31 DIAGNOSIS — N952 Postmenopausal atrophic vaginitis: Secondary | ICD-10-CM

## 2011-01-31 DIAGNOSIS — E785 Hyperlipidemia, unspecified: Secondary | ICD-10-CM

## 2011-01-31 DIAGNOSIS — Z0000 Encounter for general adult medical examination without abnormal findings: Secondary | ICD-10-CM

## 2011-01-31 DIAGNOSIS — D751 Secondary polycythemia: Secondary | ICD-10-CM

## 2011-01-31 DIAGNOSIS — M109 Gout, unspecified: Secondary | ICD-10-CM

## 2011-01-31 NOTE — Assessment & Plan Note (Signed)
Well controlled. Continue current medication.  

## 2011-01-31 NOTE — Assessment & Plan Note (Signed)
Unclear cause but stable.

## 2011-01-31 NOTE — Assessment & Plan Note (Signed)
Stable uric acid. Continue allpopurinol.

## 2011-01-31 NOTE — Progress Notes (Signed)
Subjective:    Patient ID: Gloria Lloyd, female    DOB: 03/29/1941, 70 y.o.   MRN: [id number]  HPI  I have personally reviewed the Medicare Annual Wellness questionnaire and have noted 1. The patient's medical and social history 2. Their use of alcohol, tobacco or illicit drugs 3. Their current medications and supplements 4. The patient's functional ability including ADL's, fall risks, home safety risks and hearing or visual             impairment. 5. Diet and physical activities 6. Evidence for depression or mood disorders  The patients weight, height, BMI and visual acuity have been recorded in the chart I have made referrals, counseling and provided education to the patient based review of the above and I have provided the pt with a written personalized care plan for preventive services.  In office forms completed.  Hypertension:  Borderline high today on tenormin and norvasc.  Using medication without problems or lightheadedness:  Chest pain with exertion: none Edema:None Short of breath:None Average home BPs: well controlled daily.  Other issues:  Hypothyroid: stable on  Synthroid. Lab Results  Component Value Date   TSH 1.19 01/28/2011   Gout: Uric acid 6.1 on allopurinol 100 mg daily.    High calcium: stable. Lab Results  Component Value Date   CALCIUM 10.0 01/28/2011   Prediabetes: new diagnosis.  In last few weeks.. She reports feeling ill.. "like yuck" Occ nausea. Urine with odor. Vaginal symptoms as below. No chest pain. No SOB. mild fatigue. She requentsed EKG today.   Continued vaginal burning.Marland KitchenMarland KitchenSaw GYN...agreed likely atropic vaginitis. Premarin cream did not work, used only occ though. Will have pap done upcoming.  History of prominent thoracic aorta.. nml ECHO.   Review of Systems  Constitutional: Positive for fatigue. Negative for fever.  HENT: Negative for ear pain.   Eyes: Negative for pain.  Respiratory: Negative for chest tightness and  shortness of breath.   Cardiovascular: Negative for chest pain, palpitations and leg swelling.  Gastrointestinal: Negative for abdominal pain.  Genitourinary: Negative for dysuria.       Objective:   Physical Exam  Constitutional: Vital signs are normal. She appears well-developed and well-nourished. She is cooperative.  Non-toxic appearance. She does not appear ill. No distress.  HENT:  Head: Normocephalic.  Right Ear: Hearing, tympanic membrane, external ear and ear canal normal.  Left Ear: Hearing, tympanic membrane, external ear and ear canal normal.  Nose: Nose normal.  Eyes: Conjunctivae, EOM and lids are normal. Pupils are equal, round, and reactive to light. No foreign bodies found.  Neck: Trachea normal and normal range of motion. Neck supple. Carotid bruit is not present. No mass and no thyromegaly present.  Cardiovascular: Normal rate, regular rhythm, S1 normal, S2 normal, normal heart sounds and intact distal pulses.  Exam reveals no gallop.   No murmur heard. Pulmonary/Chest: Effort normal and breath sounds normal. No respiratory distress. She has no wheezes. She has no rhonchi. She has no rales.  Abdominal: Soft. Normal appearance and bowel sounds are normal. She exhibits no distension, no fluid wave, no abdominal bruit and no mass. There is no hepatosplenomegaly. There is no tenderness. There is no rebound, no guarding and no CVA tenderness. No hernia.  Genitourinary: Pelvic exam was performed with patient prone.  Lymphadenopathy:    She has no cervical adenopathy.    She has no axillary adenopathy.  Neurological: She is alert. She has normal strength. No cranial nerve deficit or sensory deficit.  Skin: Skin is warm, dry and intact. No rash noted.  Psychiatric: Her speech is normal and behavior is normal. Judgment normal. Her mood appears not anxious. Cognition and memory are normal. She does not exhibit a depressed mood.          Assessment & Plan:  Annual Medicare  Wellness: The patient's preventative maintenance and recommended screening tests for an annual wellness exam were reviewed in full today. Brought up to date unless services declined.  Counselled on the importance of diet, exercise, and its role in overall health and mortality. The patient's FH and SH was reviewed, including their home life, tobacco status, and drug and alcohol status.   PAP, breast exam with GYN.  Scheduled for bone density, although not interested in medication. Uto Date with vaccines, look into shingles. Colon in 2010... Repeat in 2020.

## 2011-01-31 NOTE — Assessment & Plan Note (Signed)
Due for assessment at next lab draw.

## 2011-01-31 NOTE — Assessment & Plan Note (Signed)
Follow up with GYN for further eval and recommendations.

## 2011-01-31 NOTE — Assessment & Plan Note (Signed)
Stable well controlled

## 2011-01-31 NOTE — Patient Instructions (Addendum)
Make an appt with GYN for pap and vaginal symptoms. Work on [employer] in diet. Decrease bannanas. Increase fiber in diet. Stop by front desk to schedule bone density. Work on increasing exercise. Follow up if not improving.

## 2011-03-01 LAB — CBC
Hemoglobin: 16.6 — ABNORMAL HIGH
RBC: 5.24 — ABNORMAL HIGH
RDW: 13.2

## 2011-03-01 LAB — COMPREHENSIVE METABOLIC PANEL
ALT: 18
Alkaline Phosphatase: 46
Glucose, Bld: 99
Potassium: 4.1
Sodium: 138
Total Protein: 7.5

## 2011-03-07 ENCOUNTER — Ambulatory Visit
Admission: RE | Admit: 2011-03-07 | Discharge: 2011-03-07 | Disposition: A | Discharge status: Home / Self Care | Payer: Medicare Other | Source: Ambulatory Visit | Attending: Family Medicine | Admitting: Family Medicine

## 2011-03-07 DIAGNOSIS — Z780 Asymptomatic menopausal state: Secondary | ICD-10-CM

## 2011-03-08 ENCOUNTER — Encounter: Payer: Self-pay | Admitting: Family Medicine

## 2011-03-08 DIAGNOSIS — M8580 Other specified disorders of bone density and structure, unspecified site: Secondary | ICD-10-CM | POA: Insufficient documentation

## 2011-04-17 LAB — HM MAMMOGRAPHY: HM Mammogram: NORMAL

## 2011-04-23 ENCOUNTER — Encounter: Payer: Self-pay | Admitting: Family Medicine

## 2011-05-07 ENCOUNTER — Other Ambulatory Visit: Payer: Self-pay | Admitting: Family Medicine

## 2011-05-09 ENCOUNTER — Other Ambulatory Visit: Payer: Self-pay | Admitting: Family Medicine

## 2011-05-17 ENCOUNTER — Encounter: Payer: Self-pay | Admitting: Family Medicine

## 2011-08-14 ENCOUNTER — Other Ambulatory Visit: Payer: Self-pay | Admitting: Family Medicine

## 2011-09-09 ENCOUNTER — Other Ambulatory Visit: Payer: Medicare Other

## 2011-09-12 ENCOUNTER — Ambulatory Visit: Payer: Medicare Other | Admitting: Family Medicine

## 2011-10-14 ENCOUNTER — Other Ambulatory Visit: Payer: Medicare Other

## 2011-10-15 ENCOUNTER — Other Ambulatory Visit (INDEPENDENT_AMBULATORY_CARE_PROVIDER_SITE_OTHER): Payer: Medicare Other

## 2011-10-15 DIAGNOSIS — E785 Hyperlipidemia, unspecified: Secondary | ICD-10-CM

## 2011-10-15 LAB — COMPREHENSIVE METABOLIC PANEL
ALT: 15 U/L (ref 0–35)
BUN: 14 mg/dL (ref 6–23)
CO2: 25 mEq/L (ref 19–32)
Creatinine, Ser: 1 mg/dL (ref 0.4–1.2)
GFR: 59.27 mL/min — ABNORMAL LOW (ref >60.00)
Glucose, Bld: 90 mg/dL (ref 70–99)
Total Bilirubin: 0.3 mg/dL (ref 0.3–1.2)

## 2011-10-15 LAB — LIPID PANEL
Cholesterol: 229 mg/dL — ABNORMAL HIGH (ref 0–200)
HDL: 64.9 mg/dL (ref >39.00)
Triglycerides: 133 mg/dL (ref 0.0–149.0)
VLDL: 26.6 mg/dL (ref 0.0–40.0)

## 2011-10-17 ENCOUNTER — Ambulatory Visit: Payer: Medicare Other | Admitting: Family Medicine

## 2011-10-22 ENCOUNTER — Ambulatory Visit (INDEPENDENT_AMBULATORY_CARE_PROVIDER_SITE_OTHER): Payer: Medicare Other | Admitting: Family Medicine

## 2011-10-22 ENCOUNTER — Encounter: Payer: Self-pay | Admitting: Family Medicine

## 2011-10-22 VITALS — BP 110/80 | HR 64 | Temp 98.4°F | Ht 66.25 in | Wt 154.8 lb

## 2011-10-22 DIAGNOSIS — E785 Hyperlipidemia, unspecified: Secondary | ICD-10-CM

## 2011-10-22 DIAGNOSIS — I10 Essential (primary) hypertension: Secondary | ICD-10-CM

## 2011-10-22 NOTE — Progress Notes (Signed)
  Subjective:    Patient ID: Gloria Lloyd, female    DOB: 07-Jan-1941, 71 y.o.   MRN: [id number]  HPI  Hypertension: Borderline high today on tenormin and norvasc.  Using medication without problems or lightheadedness:  Chest pain with exertion: none  Edema:None  Short of breath:None  Average home BPs: well controlled daily.  Other issues:   Hypothyroid: stable on Synthroid in past. Lab Results  Component Value Date   TSH 1.19 01/28/2011     Gout: Uric acid 6.1  In 01/2011 on allopurinol 100 mg daily.   High calcium: stable.  Lab Results  Component Value Date   CALCIUM 9.8 10/15/2011   Elevated Cholesterol: Improved control on no medicaiton. She refused statin meds. Diet compliance: Good Exercise: walking, steps daily some Other complaints: Lab Results  Component Value Date   CHOL 229* 10/15/2011   HDL 64.90 10/15/2011   LDLDIRECT 155.8 10/15/2011   TRIG 133.0 10/15/2011   CHOLHDL 4 10/15/2011   Prediabtes: resolved.  Right knee, medial knee pain, stinging. Ongoing in last 2-3 months. No clicking or popping.  Has improved now. Review of Systems  Constitutional: Negative for fever and fatigue.  HENT: Negative for ear pain.   Eyes: Negative for pain.  Respiratory: Negative for chest tightness and shortness of breath.   Cardiovascular: Negative for chest pain, palpitations and leg swelling.  Gastrointestinal: Negative for abdominal pain.  Genitourinary: Negative for dysuria.       Objective:   Physical Exam  Constitutional: Vital signs are normal. She appears well-developed and well-nourished. She is cooperative.  Non-toxic appearance. She does not appear ill. No distress.  HENT:  Head: Normocephalic.  Right Ear: Hearing, tympanic membrane, external ear and ear canal normal. Tympanic membrane is not erythematous, not retracted and not bulging.  Left Ear: Hearing, tympanic membrane, external ear and ear canal normal. Tympanic membrane is not erythematous, not retracted and  not bulging.  Nose: No mucosal edema or rhinorrhea. Right sinus exhibits no maxillary sinus tenderness and no frontal sinus tenderness. Left sinus exhibits no maxillary sinus tenderness and no frontal sinus tenderness.  Mouth/Throat: Uvula is midline, oropharynx is clear and moist and mucous membranes are normal.  Eyes: Conjunctivae, EOM and lids are normal. Pupils are equal, round, and reactive to light. No foreign bodies found.  Neck: Trachea normal and normal range of motion. Neck supple. Carotid bruit is not present. No mass and no thyromegaly present.  Cardiovascular: Normal rate, regular rhythm, S1 normal, S2 normal, normal heart sounds, intact distal pulses and normal pulses.  Exam reveals no gallop and no friction rub.   No murmur heard. Pulmonary/Chest: Effort normal and breath sounds normal. Not tachypneic. No respiratory distress. She has no decreased breath sounds. She has no wheezes. She has no rhonchi. She has no rales.  Abdominal: Soft. Normal appearance and bowel sounds are normal. There is no tenderness.  Neurological: She is alert.  Skin: Skin is warm, dry and intact. No rash noted.  Psychiatric: Her speech is normal and behavior is normal. Judgment and thought content normal. Her mood appears not anxious. Cognition and memory are normal. She does not exhibit a depressed mood.          Assessment & Plan:

## 2011-10-22 NOTE — Assessment & Plan Note (Signed)
Well controlled 

## 2011-10-22 NOTE — Assessment & Plan Note (Signed)
Not at goal but improved control for her. Encouraged exercise, weight loss, healthy eating habits.

## 2011-10-22 NOTE — Assessment & Plan Note (Signed)
Well controlled. Continue current medication.  

## 2011-10-22 NOTE — Patient Instructions (Signed)
Continue working on healthy lifestyle, low cholesterol diet.

## 2011-11-19 ENCOUNTER — Other Ambulatory Visit: Payer: Self-pay | Admitting: *Deleted

## 2011-11-19 MED ORDER — OMEPRAZOLE 40 MG PO CPDR: 40.0000 mg | DELAYED_RELEASE_CAPSULE | Freq: Every day | ORAL | Status: DC

## 2011-11-19 MED ORDER — MOMETASONE FUROATE 0.1 % EX CREA: TOPICAL_CREAM | CUTANEOUS | Status: DC

## 2011-11-19 MED ORDER — AMLODIPINE BESYLATE 5 MG PO TABS: 5.0000 mg | ORAL_TABLET | Freq: Every day | ORAL | Status: DC

## 2011-11-19 MED ORDER — ATENOLOL 25 MG PO TABS: 25.0000 mg | ORAL_TABLET | Freq: Every day | ORAL | Status: DC

## 2011-11-19 MED ORDER — ALLOPURINOL 100 MG PO TABS: 100.0000 mg | ORAL_TABLET | Freq: Every day | ORAL | Status: DC

## 2011-11-19 NOTE — Telephone Encounter (Signed)
Faxed refill request from [facility], last filled 05/07/11.

## 2011-11-21 ENCOUNTER — Other Ambulatory Visit: Payer: Self-pay | Admitting: *Deleted

## 2012-02-24 ENCOUNTER — Telehealth: Payer: Self-pay | Admitting: *Deleted

## 2012-02-24 NOTE — Telephone Encounter (Signed)
Okay to refill.. Just had labs in 10/2011.Marland Kitchen Next due 04/2012

## 2012-02-24 NOTE — Telephone Encounter (Signed)
Received faxed refill request from pharmacy. Please advise when patient needs lab work. Is it okay to refill medication?

## 2012-02-25 ENCOUNTER — Other Ambulatory Visit: Payer: Self-pay

## 2012-02-25 MED ORDER — SYNTHROID 125 MCG PO TABS: 125.0000 ug | ORAL_TABLET | Freq: Every day | ORAL | Status: DC

## 2012-02-25 NOTE — Telephone Encounter (Signed)
Synthroid 125 mg #90 2 R called in  to [facility] 

## 2012-02-25 NOTE — Telephone Encounter (Signed)
Synthroid 125 mg #90 2 R called in  to [facility]

## 2012-04-01 ENCOUNTER — Ambulatory Visit (INDEPENDENT_AMBULATORY_CARE_PROVIDER_SITE_OTHER): Payer: MEDICARE

## 2012-04-01 DIAGNOSIS — Z23 Encounter for immunization: Secondary | ICD-10-CM

## 2012-05-25 ENCOUNTER — Encounter: Payer: Self-pay | Admitting: Family Medicine

## 2012-11-24 ENCOUNTER — Other Ambulatory Visit: Payer: Self-pay | Admitting: Family Medicine

## 2012-11-26 ENCOUNTER — Telehealth: Payer: Self-pay

## 2012-11-26 NOTE — Telephone Encounter (Signed)
[employer] faxed tier exception request form; form in Dr Daphine Deutscher in box.

## 2012-11-26 NOTE — Telephone Encounter (Signed)
Please call pt...does she require name brand medicaiton for some reason. Have her answer question4 if able.  Form is in my outbox.

## 2012-11-27 ENCOUNTER — Other Ambulatory Visit: Payer: Self-pay

## 2012-11-27 MED ORDER — SYNTHROID 125 MCG PO TABS: 125.0000 ug | ORAL_TABLET | Freq: Every day | ORAL | Status: DC

## 2012-11-27 NOTE — Telephone Encounter (Signed)
Stacy with [facility] [phone] left v/m that tier exception approved 11/26/12-11/26/13 approval letter to follow. [facility] notified.

## 2012-11-27 NOTE — Telephone Encounter (Signed)
[employer] Tier Exception approval v/m from 11/26/12 - 11/26/13. Approval letter to follow. Spoke with [facility] and pt needs refill also.

## 2012-12-01 ENCOUNTER — Telehealth: Payer: Self-pay | Admitting: Family Medicine

## 2012-12-01 DIAGNOSIS — M109 Gout, unspecified: Secondary | ICD-10-CM

## 2012-12-01 DIAGNOSIS — I10 Essential (primary) hypertension: Secondary | ICD-10-CM

## 2012-12-01 DIAGNOSIS — E039 Hypothyroidism, unspecified: Secondary | ICD-10-CM

## 2012-12-01 DIAGNOSIS — E785 Hyperlipidemia, unspecified: Secondary | ICD-10-CM

## 2012-12-01 DIAGNOSIS — D751 Secondary polycythemia: Secondary | ICD-10-CM

## 2012-12-01 DIAGNOSIS — M8580 Other specified disorders of bone density and structure, unspecified site: Secondary | ICD-10-CM

## 2012-12-01 NOTE — Telephone Encounter (Signed)
Message copied by Excell Seltzer on Tue Dec 01, 2012  5:37 PM ------      Message from: Alvina Chou      Created: Thu Nov 26, 2012  4:25 PM      Regarding: Lab orders for 6.25.14       Patient is scheduled for CPX labs, please order future labs, Thanks , Terri       ------

## 2012-12-02 ENCOUNTER — Other Ambulatory Visit: Payer: MEDICARE

## 2012-12-02 ENCOUNTER — Other Ambulatory Visit (INDEPENDENT_AMBULATORY_CARE_PROVIDER_SITE_OTHER): Payer: Medicare Other

## 2012-12-02 DIAGNOSIS — E039 Hypothyroidism, unspecified: Secondary | ICD-10-CM

## 2012-12-02 DIAGNOSIS — D751 Secondary polycythemia: Secondary | ICD-10-CM

## 2012-12-02 DIAGNOSIS — E785 Hyperlipidemia, unspecified: Secondary | ICD-10-CM

## 2012-12-02 DIAGNOSIS — M109 Gout, unspecified: Secondary | ICD-10-CM

## 2012-12-02 DIAGNOSIS — M8580 Other specified disorders of bone density and structure, unspecified site: Secondary | ICD-10-CM

## 2012-12-02 LAB — CBC WITH DIFFERENTIAL/PLATELET
Basophils Absolute: 0.1 10*3/uL (ref 0.0–0.1)
Eosinophils Relative: 2.3 % (ref 0.0–5.0)
HCT: 48.7 % — ABNORMAL HIGH (ref 36.0–46.0)
Hemoglobin: 15.9 g/dL — ABNORMAL HIGH (ref 12.0–15.0)
Lymphs Abs: 1.7 10*3/uL (ref 0.7–4.0)
MCV: 92.2 fl (ref 78.0–100.0)
Monocytes Absolute: 0.5 10*3/uL (ref 0.1–1.0)
Neutro Abs: 5.6 10*3/uL (ref 1.4–7.7)
Platelets: 263 10*3/uL (ref 150.0–400.0)
RDW: 13.9 % (ref 11.5–14.6)

## 2012-12-02 LAB — COMPREHENSIVE METABOLIC PANEL
Albumin: 4.2 g/dL (ref 3.5–5.2)
BUN: 9 mg/dL (ref 6–23)
CO2: 25 mEq/L (ref 19–32)
Calcium: 10 mg/dL (ref 8.4–10.5)
Chloride: 104 mEq/L (ref 96–112)
Creatinine, Ser: 0.8 mg/dL (ref 0.4–1.2)
GFR: 72.58 mL/min (ref >60.00)
Glucose, Bld: 89 mg/dL (ref 70–99)

## 2012-12-02 LAB — LDL CHOLESTEROL, DIRECT: Direct LDL: 181.2 mg/dL

## 2012-12-02 LAB — LIPID PANEL
Cholesterol: 258 mg/dL — ABNORMAL HIGH (ref 0–200)
Triglycerides: 143 mg/dL (ref 0.0–149.0)

## 2012-12-09 ENCOUNTER — Telehealth: Payer: Self-pay | Admitting: Family Medicine

## 2012-12-09 ENCOUNTER — Encounter: Payer: Self-pay | Admitting: Family Medicine

## 2012-12-09 ENCOUNTER — Ambulatory Visit (INDEPENDENT_AMBULATORY_CARE_PROVIDER_SITE_OTHER): Payer: Medicare Other | Admitting: Family Medicine

## 2012-12-09 VITALS — BP 120/84 | HR 66 | Temp 98.2°F | Ht 66.25 in | Wt 153.8 lb

## 2012-12-09 DIAGNOSIS — Z860100 Personal history of colon polyps, unspecified: Secondary | ICD-10-CM

## 2012-12-09 DIAGNOSIS — E039 Hypothyroidism, unspecified: Secondary | ICD-10-CM

## 2012-12-09 DIAGNOSIS — Z0000 Encounter for general adult medical examination without abnormal findings: Secondary | ICD-10-CM

## 2012-12-09 DIAGNOSIS — Z113 Encounter for screening for infections with a predominantly sexual mode of transmission: Secondary | ICD-10-CM

## 2012-12-09 DIAGNOSIS — Z1212 Encounter for screening for malignant neoplasm of rectum: Secondary | ICD-10-CM

## 2012-12-09 DIAGNOSIS — E785 Hyperlipidemia, unspecified: Secondary | ICD-10-CM

## 2012-12-09 DIAGNOSIS — I10 Essential (primary) hypertension: Secondary | ICD-10-CM

## 2012-12-09 MED ORDER — ALLOPURINOL 100 MG PO TABS: ORAL_TABLET | ORAL | Status: DC

## 2012-12-09 MED ORDER — ATENOLOL 25 MG PO TABS: 25.0000 mg | ORAL_TABLET | Freq: Every day | ORAL | Status: DC

## 2012-12-09 MED ORDER — SYNTHROID 125 MCG PO TABS: 125.0000 ug | ORAL_TABLET | Freq: Every day | ORAL | Status: DC

## 2012-12-09 MED ORDER — OMEPRAZOLE 40 MG PO CPDR: DELAYED_RELEASE_CAPSULE | ORAL | Status: DC

## 2012-12-09 MED ORDER — AMLODIPINE BESYLATE 5 MG PO TABS: 5.0000 mg | ORAL_TABLET | Freq: Every day | ORAL | Status: DC

## 2012-12-09 NOTE — Telephone Encounter (Signed)
Message copied by Excell Seltzer on Wed Dec 09, 2012  5:24 PM ------      Message from: Josph Macho A      Created: Wed Dec 09, 2012  3:20 PM       Patient would like order for iFOB please. Given kit at lab draw ------

## 2012-12-09 NOTE — Patient Instructions (Addendum)
Check BP at home... Make sure running <140/90. Return in 1 month for lab only visit to recehck thyroid function. Increase exercise, work on low cholesterol diet. Consider trying red yeast rice 1200 mg twice daily. Keep appt with mamo Stop at lab on your way out.

## 2012-12-09 NOTE — Progress Notes (Signed)
HPI  I have personally reviewed the Medicare Annual Wellness questionnaire and have noted  1. The patient's medical and social history  2. Their use of alcohol, tobacco or illicit drugs  3. Their current medications and supplements  4. The patient's functional ability including ADL's, fall risks, home safety risks and hearing or visual  impairment.  5. Diet and physical activities  6. Evidence for depression or mood disorders  The patients weight, height, BMI and visual acuity have been recorded in the chart  I have made referrals, counseling and provided education to the patient based review of the above and I have provided the pt with a written personalized care plan for preventive services.  In office forms completed.   Hypertension: Well controlled on tenormin and norvasc.  Using medication without problems or lightheadedness: None Chest pain with exertion: none  Edema:None  Short of breath:None  Average home BPs: well controlled daily when she checks it. Other issues:   Elevated Cholesterol:Refuses medication to treat. Diet compliance: Moderate Exercise:Walking some Other complaints:  Lab Results  Component Value Date   CHOL 258* 12/02/2012   HDL 61.90 12/02/2012   LDLDIRECT 181.2 12/02/2012   TRIG 143.0 12/02/2012   CHOLHDL 4 12/02/2012     Hypothyroid: stable on Synthroid.  Lab Results  Component Value Date   TSH 0.27* 12/02/2012    Gout: Uric acid 5.5 on allopurinol 100 mg daily.   High calcium: stable.  Lab Results  Component Value Date   CALCIUM 10.0 12/02/2012   Prediabetes: new diagnosis.   History of prominent thoracic aorta.. nml ECHO.   She is concerned about exposure to herpes simplex years ago... At [facility]? She would like STD screening for HIV and repeat herpes testing.  She has no further outbreaks.  History   Social History  . Marital Status: Married    Spouse Name: N/A    Number of Children: 2  . Years of Education: N/A    Occupational History  . retired    Social History Main Topics  . Smoking status: Former Games developer  . Smokeless tobacco: None  . Alcohol Use: No  . Drug Use: No  . Sexually Active: Not Currently   Other Topics Concern  . None   Social History Narrative   Regular exercise--no, walking some.   Diet: fruit and veggies, water   No living will, HCPOA is husband, full code (reviewed 2014)                    Review of Systems  Constitutional: Negative for fatigue. Negative for fever.  HENT: Negative for ear pain.  Eyes: Negative for pain.  Respiratory: Negative for chest tightness and shortness of breath.  Cardiovascular: Negative for chest pain, palpitations and leg swelling.  Gastrointestinal: Negative for abdominal pain.  No BM issues Genitourinary: Negative for dysuria.  Normal mood.  Objective:   Physical Exam  Constitutional: Vital signs are normal. She appears well-developed and well-nourished. She is cooperative. Non-toxic appearance. She does not appear ill. No distress.  HENT:  Head: Normocephalic.  Right Ear: Hearing, tympanic membrane, external ear and ear canal normal.  Left Ear: Hearing, tympanic membrane, external ear and ear canal normal.  Nose: Nose normal.  Eyes: Conjunctivae, EOM and lids are normal. Pupils are equal, round, and reactive to light. No foreign bodies found.  Neck: Trachea normal and normal range of motion. Neck supple. Carotid bruit is not present. No mass and no thyromegaly present.  Cardiovascular:  Normal rate, regular rhythm, S1 normal, S2 normal, normal heart sounds and intact distal pulses. Exam reveals no gallop.  No murmur heard.  Pulmonary/Chest: Effort normal and breath sounds normal. No respiratory distress. She has no wheezes. She has no rhonchi. She has no rales.  Abdominal: Soft. Normal appearance and bowel sounds are normal. She exhibits no distension, no fluid wave, no abdominal bruit and no mass. There is no hepatosplenomegaly.  There is no tenderness. There is no rebound, no guarding and no CVA tenderness. No hernia.  Genitourinary: Pelvic exam was performed with patient prone.   Breast exam: No mass Lymphadenopathy:  She has no cervical adenopathy.  She has no axillary adenopathy.  Neurological: She is alert. She has normal strength. No cranial nerve deficit or sensory deficit.  Skin: Skin is warm, dry and intact. No rash noted.  Psychiatric: Her speech is normal and behavior is normal. Judgment normal. Her mood appears not anxious. Cognition and memory are normal. She does not exhibit a depressed mood.  Assessment & Plan:   Annual Medicare Wellness: The patient's preventative maintenance and recommended screening tests for an annual wellness exam were reviewed in full today.  Brought up to date unless services declined.  Counselled on the importance of diet, exercise, and its role in overall health and mortality.  The patient's FH and SH was reviewed, including their home life, tobacco status, and drug and alcohol status.   PAP/DVE: not indicated,  No family history of uterine or ovarian cancer, asymptomatic. Bone density: 04/2011 osteopenia in hip, nml in spine, plan repeat in 5 years. Up to Date with vaccines, not interested in shingles.  Colon in 2010... Repeat in 2020. Mammo: 2013, have schedule in fall.

## 2012-12-10 LAB — HSV(HERPES SMPLX)ABS-I+II(IGG+IGM)-BLD
HSV 2 Glycoprotein G Ab, IgG: 1.08 IV — ABNORMAL HIGH
Herpes Simplex Vrs I&II-IgM Ab (EIA): 2.8 INDEX — ABNORMAL HIGH

## 2012-12-14 ENCOUNTER — Telehealth: Payer: Self-pay

## 2012-12-14 NOTE — Telephone Encounter (Signed)
See lab result note. Pt already diagnosed with herpes in past.. She just wanted confirmation.

## 2012-12-14 NOTE — Telephone Encounter (Signed)
Pt left v/m returning call and request cb. 

## 2012-12-14 NOTE — Telephone Encounter (Signed)
Pt left v/m returning call about test results; pt request cb.

## 2012-12-14 NOTE — Telephone Encounter (Signed)
i would rather dr. b give these results given nature

## 2012-12-14 NOTE — Telephone Encounter (Signed)
Awaiting dr. Ermalene Searing to look at labs before patient can be advised

## 2012-12-25 ENCOUNTER — Telehealth: Payer: Self-pay | Admitting: Family Medicine

## 2012-12-25 NOTE — Telephone Encounter (Signed)
Pt left vm stating she missed a call from Dr. Ermalene Searing on 7/17 and requests a call back.

## 2012-12-25 NOTE — Telephone Encounter (Signed)
Was this you heather call ing with my message abot the test reults? I did not call her.

## 2012-12-25 NOTE — Telephone Encounter (Signed)
Called in error bc opened encounter sent to me and she was advised through result note. Patient advised .

## 2013-01-04 ENCOUNTER — Encounter: Payer: Self-pay | Admitting: Radiology

## 2013-01-04 DIAGNOSIS — Z113 Encounter for screening for infections with a predominantly sexual mode of transmission: Secondary | ICD-10-CM | POA: Insufficient documentation

## 2013-01-07 ENCOUNTER — Telehealth: Payer: Self-pay

## 2013-01-07 NOTE — Telephone Encounter (Signed)
Advised patient as instructed. 

## 2013-01-07 NOTE — Telephone Encounter (Signed)
She should take her thyroid med when she usually does.

## 2013-01-07 NOTE — Telephone Encounter (Signed)
Pt left v/m that she has lab appt scheduled on 01/11/13 at 11:15 am for thyroid test; pt wants to know if should take thyroid med that morning or wait until after lab drawn.Please advise.

## 2013-01-11 ENCOUNTER — Other Ambulatory Visit (INDEPENDENT_AMBULATORY_CARE_PROVIDER_SITE_OTHER): Payer: Medicare Other

## 2013-01-11 DIAGNOSIS — E039 Hypothyroidism, unspecified: Secondary | ICD-10-CM

## 2013-01-12 ENCOUNTER — Ambulatory Visit: Payer: Medicare Other

## 2013-01-12 DIAGNOSIS — E039 Hypothyroidism, unspecified: Secondary | ICD-10-CM

## 2013-01-13 ENCOUNTER — Telehealth: Payer: Self-pay | Admitting: *Deleted

## 2013-01-13 LAB — T3, FREE: T3, Free: 2.9 pg/mL (ref 2.3–4.2)

## 2013-01-13 LAB — T4, FREE: Free T4: 1.8 ng/dL — ABNORMAL HIGH (ref 0.60–1.60)

## 2013-01-13 NOTE — Telephone Encounter (Signed)
Pt calling asking for lab results, I see they are not complete, please advise

## 2013-01-13 NOTE — Telephone Encounter (Signed)
Advised patient.  She will be going out of town tomorrow and will call us when she gets back next week.  She does not want any prescriptions sent to her pharmacy until she calls [place] back.

## 2013-01-13 NOTE — Telephone Encounter (Signed)
Let pt know I am waiting for  Specific free t3 and free t4 test, which is not back yet. We will likely call her on Friday with recommendations

## 2013-01-13 NOTE — Telephone Encounter (Signed)
Let her know we can keep it the same and have her follow up in 1 month with labs prior for TSH, free t3 and free t4.

## 2013-01-13 NOTE — Telephone Encounter (Signed)
Spoke with patient regarding her lab results.  She wants to continue on her current dose of synthroid if this is ok with you, if you dont think she will be doing herself any harm.  She plans to call back to schedule a follow up visit with you in about a month.

## 2013-01-14 ENCOUNTER — Other Ambulatory Visit: Payer: Self-pay | Admitting: *Deleted

## 2013-01-14 MED ORDER — SYNTHROID 125 MCG PO TABS: 125.0000 ug | ORAL_TABLET | Freq: Every day | ORAL | Status: DC

## 2013-01-14 NOTE — Telephone Encounter (Signed)
Requested change in quantity dispensed. 3 month supply sent in.

## 2013-01-14 NOTE — Telephone Encounter (Signed)
Advised patient, follow up appts scheduled.

## 2013-01-14 NOTE — Telephone Encounter (Signed)
Pt left v/m requesting cb [phone].

## 2013-01-14 NOTE — Telephone Encounter (Signed)
Left message asking patient to call back

## 2013-02-11 ENCOUNTER — Other Ambulatory Visit (INDEPENDENT_AMBULATORY_CARE_PROVIDER_SITE_OTHER): Payer: Medicare Other

## 2013-02-11 DIAGNOSIS — E039 Hypothyroidism, unspecified: Secondary | ICD-10-CM

## 2013-02-11 DIAGNOSIS — Z1212 Encounter for screening for malignant neoplasm of rectum: Secondary | ICD-10-CM

## 2013-02-11 LAB — TSH: TSH: 0.11 u[IU]/mL — ABNORMAL LOW (ref 0.35–5.50)

## 2013-02-11 LAB — FECAL OCCULT BLOOD, IMMUNOCHEMICAL: Fecal Occult Bld: POSITIVE

## 2013-02-12 ENCOUNTER — Telehealth: Payer: Self-pay

## 2013-02-12 NOTE — Telephone Encounter (Signed)
Pt left v/m requesting results left on cell phone v/m if no answer [phone].

## 2013-02-12 NOTE — Telephone Encounter (Signed)
Pt left v/m returning call form 02/11/13. Pt request call back [phone].

## 2013-02-12 NOTE — Telephone Encounter (Signed)
Returned call from Gloria Lloyd.  Advised her I had not called her about any results but I bet the call she received was just a reminder of her appointment she has scheduled for next week.

## 2013-02-16 ENCOUNTER — Ambulatory Visit: Payer: Medicare Other | Admitting: Family Medicine

## 2013-02-19 ENCOUNTER — Ambulatory Visit (INDEPENDENT_AMBULATORY_CARE_PROVIDER_SITE_OTHER)
Admission: RE | Admit: 2013-02-19 | Discharge: 2013-02-19 | Disposition: A | Discharge status: Home / Self Care | Payer: Medicare Other | Source: Ambulatory Visit | Attending: Family Medicine | Admitting: Family Medicine

## 2013-02-19 ENCOUNTER — Encounter: Payer: Self-pay | Admitting: Family Medicine

## 2013-02-19 ENCOUNTER — Ambulatory Visit (INDEPENDENT_AMBULATORY_CARE_PROVIDER_SITE_OTHER): Payer: Medicare Other | Admitting: Family Medicine

## 2013-02-19 VITALS — BP 120/70 | HR 65 | Temp 98.8°F | Ht 66.25 in | Wt 154.5 lb

## 2013-02-19 DIAGNOSIS — K219 Gastro-esophageal reflux disease without esophagitis: Secondary | ICD-10-CM

## 2013-02-19 DIAGNOSIS — Z87891 Personal history of nicotine dependence: Secondary | ICD-10-CM

## 2013-02-19 DIAGNOSIS — E039 Hypothyroidism, unspecified: Secondary | ICD-10-CM

## 2013-02-19 DIAGNOSIS — M109 Gout, unspecified: Secondary | ICD-10-CM

## 2013-02-19 DIAGNOSIS — K921 Melena: Secondary | ICD-10-CM

## 2013-02-19 DIAGNOSIS — Z23 Encounter for immunization: Secondary | ICD-10-CM

## 2013-02-19 MED ORDER — ESTROGENS, CONJUGATED 0.625 MG/GM VA CREA: TOPICAL_CREAM | VAGINAL | Status: DC | PRN

## 2013-02-19 NOTE — Progress Notes (Signed)
  Subjective:    Patient ID: Gloria Lloyd, female    DOB: Jan 27, 1941, 72 y.o.   MRN: [id number]  HPI  72 year old female with hypothyroid presents after dose adjustment down of levothyroxine.  She reports she is feeling well. No fatigue.  She is not feeling heat or cold intolerance.  No weight change.  Lab Results  Component Value Date   TSH 0.11* 02/11/2013  Her free t4 is lower but not at goal. Free t3 remains nml.  She wishes to not adjust med if able not to.   She has smoking history of 40-50 pack year history. No daily cough, no SOB.  She wishes to have a screening CXR   She has noted mild tenderness and swelling in 4th toe. Hx of gout. She is on allopurinol. Last uric acid 5.5.  Positive hemoccult discussed in detail.      Review of Systems  Constitutional: Negative for fever and fatigue.  HENT: Negative for ear pain.   Eyes: Negative for pain.  Respiratory: Negative for chest tightness and shortness of breath.   Cardiovascular: Negative for chest pain, palpitations and leg swelling.  Gastrointestinal: Negative for abdominal pain.  Genitourinary: Negative for dysuria.       Objective:   Physical Exam  Constitutional: Vital signs are normal. She appears well-developed and well-nourished. She is cooperative.  Non-toxic appearance. She does not appear ill. No distress.  HENT:  Head: Normocephalic.  Right Ear: Hearing, tympanic membrane, external ear and ear canal normal. Tympanic membrane is not erythematous, not retracted and not bulging.  Left Ear: Hearing, tympanic membrane, external ear and ear canal normal. Tympanic membrane is not erythematous, not retracted and not bulging.  Nose: No mucosal edema or rhinorrhea. Right sinus exhibits no maxillary sinus tenderness and no frontal sinus tenderness. Left sinus exhibits no maxillary sinus tenderness and no frontal sinus tenderness.  Mouth/Throat: Uvula is midline, oropharynx is clear and moist and mucous  membranes are normal.  Eyes: Conjunctivae, EOM and lids are normal. Pupils are equal, round, and reactive to light. Lids are everted and swept, no foreign bodies found.  Neck: Trachea normal and normal range of motion. Neck supple. Carotid bruit is not present. No mass and no thyromegaly present.  Cardiovascular: Normal rate, regular rhythm, S1 normal, S2 normal, normal heart sounds, intact distal pulses and normal pulses.  Exam reveals no gallop and no friction rub.   No murmur heard. Pulmonary/Chest: Effort normal and breath sounds normal. Not tachypneic. No respiratory distress. She has no decreased breath sounds. She has no wheezes. She has no rhonchi. She has no rales.  Abdominal: Soft. Normal appearance and bowel sounds are normal. There is no tenderness.  Neurological: She is alert.  Skin: Skin is warm, dry and intact. No rash noted.  Slight erythema, minimal soreness in right 4th digit distally in area where gout has been in past  Psychiatric: Her speech is normal and behavior is normal. Judgment and thought content normal. Her mood appears not anxious. Cognition and memory are normal. She does not exhibit a depressed mood.          Assessment & Plan:

## 2013-02-19 NOTE — Patient Instructions (Addendum)
Return for TSH and free t4 in 3 months. Lab only. Okay to schedule pap smear and pelvic exam if interested.  We will call you back with X-ray results.  Okay to use colchicine 2 tabs of 0.6 mg x 1... That should knock the early gout out.  Okay to wean off omeprazole if able.  Call to make appt to discuss positive hemeoccult with Dr. Russella Dar.

## 2013-02-22 ENCOUNTER — Encounter: Payer: Self-pay | Admitting: Gastroenterology

## 2013-02-24 NOTE — Assessment & Plan Note (Signed)
Borderline overtreated, somewhat improved without med change. Pt denies symptoms... Will continue to follow.

## 2013-02-24 NOTE — Assessment & Plan Note (Signed)
Recommended referral for colonoscopy.

## 2013-02-24 NOTE — Assessment & Plan Note (Signed)
Well controlled . Okay to wean off omeprazole if able.

## 2013-02-24 NOTE — Assessment & Plan Note (Signed)
CXR screening performed.

## 2013-02-24 NOTE — Assessment & Plan Note (Signed)
Possible mild early gout returning in 4th digit. Okay to use colchicine 2 tabs of 0.6 mg x 1. Continue allopurinol.

## 2013-03-09 ENCOUNTER — Other Ambulatory Visit (HOSPITAL_COMMUNITY)
Admission: RE | Admit: 2013-03-09 | Discharge: 2013-03-09 | Disposition: A | Discharge status: Home / Self Care | Payer: Medicare Other | Source: Ambulatory Visit | Attending: Family Medicine | Admitting: Family Medicine

## 2013-03-09 ENCOUNTER — Encounter: Payer: Self-pay | Admitting: Family Medicine

## 2013-03-09 ENCOUNTER — Ambulatory Visit (INDEPENDENT_AMBULATORY_CARE_PROVIDER_SITE_OTHER): Payer: Medicare Other | Admitting: Family Medicine

## 2013-03-09 VITALS — BP 106/70 | HR 85 | Temp 98.5°F | Ht 66.25 in | Wt 153.0 lb

## 2013-03-09 DIAGNOSIS — Z1151 Encounter for screening for human papillomavirus (HPV): Secondary | ICD-10-CM | POA: Insufficient documentation

## 2013-03-09 DIAGNOSIS — Z01419 Encounter for gynecological examination (general) (routine) without abnormal findings: Secondary | ICD-10-CM | POA: Insufficient documentation

## 2013-03-09 DIAGNOSIS — Z124 Encounter for screening for malignant neoplasm of cervix: Secondary | ICD-10-CM

## 2013-03-09 MED ORDER — OMEPRAZOLE 40 MG PO CPDR: DELAYED_RELEASE_CAPSULE | ORAL | Status: DC

## 2013-03-09 MED ORDER — SYNTHROID 125 MCG PO TABS: 125.0000 ug | ORAL_TABLET | Freq: Every day | ORAL | Status: DC

## 2013-03-09 NOTE — Progress Notes (Signed)
  Subjective:    Patient ID: Gloria Lloyd, female    DOB: [birth year], 72 y.o.   MRN: [id number]  HPI  72 year old female returns for requested continued cervical cancer prevention with yearly pap smears.  No vaginal discharge, no lower abdominal pain.  Rest of medicare wellness performed at a previous visit.   Review of Systems  Genitourinary: Negative for dysuria, urgency, vaginal bleeding, vaginal discharge, vaginal pain, menstrual problem and pelvic pain.       Objective:   Physical Exam  Genitourinary: No labial fusion. There is no rash, tenderness, lesion or injury on the right labia. There is no rash, tenderness, lesion or injury on the left labia. Uterus is not deviated, not enlarged, not fixed and not tender. Cervix exhibits no motion tenderness, no discharge and no friability. Right adnexum displays no mass, no tenderness and no fullness. Left adnexum displays no mass, no tenderness and no fullness. No erythema, tenderness or bleeding around the vagina. No foreign body around the vagina. No signs of injury around the vagina. No vaginal discharge found.          Assessment & Plan:

## 2013-03-09 NOTE — Addendum Note (Signed)
Addended by: Damita Lack on: 03/09/2013 02:24 PM   Modules accepted: Orders

## 2013-03-12 ENCOUNTER — Encounter: Payer: Self-pay | Admitting: *Deleted

## 2013-03-24 ENCOUNTER — Telehealth: Payer: Self-pay

## 2013-03-24 MED ORDER — COLCHICINE 0.6 MG PO TABS: ORAL_TABLET | ORAL | Status: DC

## 2013-03-24 NOTE — Telephone Encounter (Signed)
Pt left v/m; when pt was seen 02/19/13 Dr Ermalene Searing recommended colchicine 0.6 mg taking 2 tab x 1. Pt said colchicine was never sent to pharmacy. Pt still having problem with gouty toe and request colchicine 0.6 mg # 2 sent to [facility].Please advise.

## 2013-03-24 NOTE — Telephone Encounter (Signed)
Left message for Ms. Benney that prescription has been sent to [facility].

## 2013-04-05 ENCOUNTER — Encounter: Payer: Self-pay | Admitting: Gastroenterology

## 2013-04-05 ENCOUNTER — Ambulatory Visit (INDEPENDENT_AMBULATORY_CARE_PROVIDER_SITE_OTHER): Payer: Medicare Other | Admitting: Gastroenterology

## 2013-04-05 VITALS — BP 132/80 | HR 64 | Ht 66.25 in | Wt 153.0 lb

## 2013-04-05 DIAGNOSIS — R195 Other fecal abnormalities: Secondary | ICD-10-CM

## 2013-04-05 DIAGNOSIS — Z86010 Personal history of colonic polyps: Secondary | ICD-10-CM

## 2013-04-05 DIAGNOSIS — K219 Gastro-esophageal reflux disease without esophagitis: Secondary | ICD-10-CM

## 2013-04-05 MED ORDER — PEG-KCL-NACL-NASULF-NA ASC-C 100 G PO SOLR: 1.0000 | Freq: Once | ORAL | Status: DC

## 2013-04-05 NOTE — Patient Instructions (Signed)
You have been scheduled for a colonoscopy with propofol. Please follow written instructions given to you at your visit today.  Please pick up your prep kit at the pharmacy within the next 1-3 days. If you use inhalers (even only as needed), please bring them with you on the day of your procedure.  Thank you for choosing me and [facility].  Malcolm T. Stark, Jr., MD., FACG    

## 2013-04-05 NOTE — Progress Notes (Signed)
    History of Present Illness: This is a 72 year old female accompanied by her husband. She was recently found to have Hemosure positive stool. She has no GI complaints. Reflux is well controlled on daily omeprazole. Her last colonoscopy for polyp followup was in June 2010. Denies weight loss, abdominal pain, constipation, diarrhea, change in stool caliber, melena, hematochezia, nausea, vomiting, dysphagia, reflux symptoms, chest pain.  Review of Systems: Pertinent positive and negative review of systems were noted in the above HPI section. All other review of systems were otherwise negative.  Current Medications, Allergies, Past Medical History, Past Surgical History, Family History and Social History were reviewed in [employer] record.  Physical Exam: General: Well developed , well nourished, no acute distress Head: Normocephalic and atraumatic Eyes:  sclerae anicteric, EOMI Ears: Normal auditory acuity Mouth: No deformity or lesions Neck: Supple, no masses or thyromegaly Lungs: Clear throughout to auscultation Heart: Regular rate and rhythm; no murmurs, rubs or bruits Abdomen: Soft, non tender and non distended. No masses, hepatosplenomegaly or hernias noted. Normal Bowel sounds Rectal: Deferred to colonoscopy Musculoskeletal: Symmetrical with no gross deformities  Skin: No lesions on visible extremities Pulses:  Normal pulses noted Extremities: No clubbing, cyanosis, edema or deformities noted Neurological: Alert oriented x 4, grossly nonfocal Cervical Nodes:  No significant cervical adenopathy Inguinal Nodes: No significant inguinal adenopathy Psychological:  Alert and cooperative. Normal mood and affect  Assessment and Recommendations:  1. Hemosure positive stool. Personal history of adenomatous colon polyps. Rule out colorectal neoplasms. The risks, benefits, and alternatives to colonoscopy with possible biopsy and possible polypectomy were discussed with  the patient and they consent to proceed.   2. GERD with a history of erosive esophagitis. Continue omeprazole 40 mg daily and standard antireflux measures.

## 2013-04-08 ENCOUNTER — Ambulatory Visit (AMBULATORY_SURGERY_CENTER): Payer: Medicare Other | Admitting: Gastroenterology

## 2013-04-08 ENCOUNTER — Encounter: Payer: Self-pay | Admitting: Gastroenterology

## 2013-04-08 VITALS — BP 141/89 | HR 48 | Temp 97.4°F | Resp 23 | Ht 66.0 in | Wt 153.0 lb

## 2013-04-08 DIAGNOSIS — D126 Benign neoplasm of colon, unspecified: Secondary | ICD-10-CM

## 2013-04-08 DIAGNOSIS — R195 Other fecal abnormalities: Secondary | ICD-10-CM

## 2013-04-08 DIAGNOSIS — Z86010 Personal history of colonic polyps: Secondary | ICD-10-CM

## 2013-04-08 MED ORDER — SODIUM CHLORIDE 0.9 % IV SOLN: 500.0000 mL | INTRAVENOUS | Status: DC

## 2013-04-08 NOTE — Op Note (Signed)
[facility] [address] [place], [address]   COLONOSCOPY PROCEDURE REPORT PATIENT: Gloria Lloyd, Gloria Lloyd  MR#: [id number] BIRTHDATE: 10-15-1940 , 72  yrs. old GENDER: Female ENDOSCOPIST: Meryl Dare, MD, [facility] PROCEDURE DATE:  04/08/2013 PROCEDURE:   Colonoscopy with biopsy and snare polypectomy First Screening Colonoscopy - Avg.  risk and is 50 yrs.  old or older - No.  Prior Negative Screening - Now for repeat screening. N/A  History of Adenoma - Now for follow-up colonoscopy & has been > or = to 3 yrs.  Yes hx of adenoma.  Has been 3 or more years since last colonoscopy.  Polyps Removed Today? Yes. ASA CLASS:   Class II INDICATIONS:Patient's personal history of adenomatous colon polyps and heme-positive stool. MEDICATIONS: MAC sedation, administered by CRNA and propofol (Diprivan) 90mg  IV DESCRIPTION OF PROCEDURE:   After the risks benefits and alternatives of the procedure were thoroughly explained, informed consent was obtained.  A digital rectal exam revealed no abnormalities of the rectum.   The LB [id number]  endoscope was introduced through the anus and advanced to the cecum, which was identified by both the appendix and ileocecal valve. No adverse events experienced.   The quality of the prep was excellent, using MoviPrep  The instrument was then slowly withdrawn as the colon was fully examined.  COLON FINDINGS: A sessile polyp measuring 4 mm in size was found at the ileocecal valve.  A polypectomy was performed with cold forceps.  The resection was complete and the polyp tissue was completely retrieved.   A sessile polyp measuring 5 mm in size was found in the transverse colon.  A polypectomy was performed with a cold snare.  The resection was complete and the polyp tissue was completely retrieved.   Mild diverticulosis was noted in the transverse colon.   Moderate diverticulosis was noted in the sigmoid colon and descending colon.   The colon  was otherwise normal.  There was no diverticulosis, inflammation, polyps or cancers unless previously stated.  Retroflexed views revealed small internal hemorrhoids. The time to cecum=2 minutes 30 seconds. Withdrawal time=8 minutes 56 seconds.  The scope was withdrawn and the procedure completed. COMPLICATIONS: There were no complications. ENDOSCOPIC IMPRESSION: 1.   Sessile polyp measuring 4 mm at the ileocecal valve; polypectomy performed with cold forceps 2.   Sessile polyp measuring 5 mm in the transverse colon; polypectomy performed with a cold snare 3.   Mild diverticulosis in the transverse colon 4.   Moderate diverticulosis in the sigmoid colon and descending colon 5.   Small internal hemorrhoids RECOMMENDATIONS: 1.  Await pathology results 2.  High fiber diet with liberal fluid intake. 3.  Repeat Colonoscopy in 5 years.  eSigned:  Meryl Dare, MD, [facility] 04/08/2013 10:39 AM

## 2013-04-08 NOTE — Progress Notes (Signed)
Called to room to assist during endoscopic procedure.  Patient ID and intended procedure confirmed with present staff. Received instructions for my participation in the procedure from the performing physician.  

## 2013-04-08 NOTE — Patient Instructions (Signed)

## 2013-04-08 NOTE — Progress Notes (Signed)
Lidocaine-40mg IV prior to Propofol InductionPropofol given over incremental dosages 

## 2013-04-08 NOTE — Progress Notes (Signed)
Patient did not experience any of the following events: a burn prior to discharge; a fall within the facility; wrong site/side/patient/procedure/implant event; or a hospital transfer or hospital admission upon discharge from the facility. (G8907) Patient did not have preoperative order for IV antibiotic SSI prophylaxis. (G8918)  

## 2013-04-09 ENCOUNTER — Telehealth: Payer: Self-pay | Admitting: *Deleted

## 2013-04-09 NOTE — Telephone Encounter (Signed)
No answer, left message to call if questions or concerns. 

## 2013-04-15 ENCOUNTER — Encounter: Payer: Self-pay | Admitting: Gastroenterology

## 2013-06-01 ENCOUNTER — Encounter: Payer: Self-pay | Admitting: Family Medicine

## 2013-12-21 ENCOUNTER — Other Ambulatory Visit: Payer: Self-pay | Admitting: Family Medicine

## 2014-01-14 ENCOUNTER — Telehealth: Payer: Self-pay | Admitting: Family Medicine

## 2014-01-14 DIAGNOSIS — E785 Hyperlipidemia, unspecified: Secondary | ICD-10-CM

## 2014-01-14 DIAGNOSIS — E039 Hypothyroidism, unspecified: Secondary | ICD-10-CM

## 2014-01-14 DIAGNOSIS — D751 Secondary polycythemia: Secondary | ICD-10-CM

## 2014-01-14 DIAGNOSIS — M109 Gout, unspecified: Secondary | ICD-10-CM

## 2014-01-14 DIAGNOSIS — M8580 Other specified disorders of bone density and structure, unspecified site: Secondary | ICD-10-CM

## 2014-01-14 NOTE — Telephone Encounter (Signed)
Message copied by Jinny Sanders on Fri Jan 14, 2014  4:45 PM ------      Message from: Ellamae Sia      Created: Mon Jan 10, 2014 10:43 AM      Regarding: Lab orders for Monday, 8.10.15       Patient is scheduled for CPX labs, please order future labs, Thanks , Terri       ------

## 2014-01-17 ENCOUNTER — Other Ambulatory Visit (INDEPENDENT_AMBULATORY_CARE_PROVIDER_SITE_OTHER): Payer: Medicare Other

## 2014-01-17 DIAGNOSIS — E039 Hypothyroidism, unspecified: Secondary | ICD-10-CM

## 2014-01-17 DIAGNOSIS — M899 Disorder of bone, unspecified: Secondary | ICD-10-CM

## 2014-01-17 DIAGNOSIS — E785 Hyperlipidemia, unspecified: Secondary | ICD-10-CM

## 2014-01-17 DIAGNOSIS — M109 Gout, unspecified: Secondary | ICD-10-CM

## 2014-01-17 DIAGNOSIS — D751 Secondary polycythemia: Secondary | ICD-10-CM

## 2014-01-17 DIAGNOSIS — M8580 Other specified disorders of bone density and structure, unspecified site: Secondary | ICD-10-CM

## 2014-01-17 DIAGNOSIS — I10 Essential (primary) hypertension: Secondary | ICD-10-CM

## 2014-01-17 DIAGNOSIS — M949 Disorder of cartilage, unspecified: Secondary | ICD-10-CM

## 2014-01-17 LAB — CBC WITH DIFFERENTIAL/PLATELET
BASOS ABS: 0.1 10*3/uL (ref 0.0–0.1)
Basophils Relative: 0.5 % (ref 0.0–3.0)
EOS ABS: 0.1 10*3/uL (ref 0.0–0.7)
Eosinophils Relative: 1.3 % (ref 0.0–5.0)
HCT: 49.8 % — ABNORMAL HIGH (ref 36.0–46.0)
Hemoglobin: 16.7 g/dL — ABNORMAL HIGH (ref 12.0–15.0)
LYMPHS PCT: 17.8 % (ref 12.0–46.0)
Lymphs Abs: 2 10*3/uL (ref 0.7–4.0)
MCHC: 33.5 g/dL (ref 30.0–36.0)
MCV: 91.4 fl (ref 78.0–100.0)
MONO ABS: 0.6 10*3/uL (ref 0.1–1.0)
Monocytes Relative: 5.5 % (ref 3.0–12.0)
NEUTROS ABS: 8.3 10*3/uL — AB (ref 1.4–7.7)
Neutrophils Relative %: 74.9 % (ref 43.0–77.0)
Platelets: 307 10*3/uL (ref 150.0–400.0)
RBC: 5.44 Mil/uL — ABNORMAL HIGH (ref 3.87–5.11)
RDW: 13.5 % (ref 11.5–15.5)
WBC: 11.1 10*3/uL — ABNORMAL HIGH (ref 4.0–10.5)

## 2014-01-17 LAB — COMPREHENSIVE METABOLIC PANEL
ALK PHOS: 45 U/L (ref 39–117)
ALT: 12 U/L (ref 0–35)
AST: 18 U/L (ref 0–37)
Albumin: 4.1 g/dL (ref 3.5–5.2)
BUN: 13 mg/dL (ref 6–23)
CHLORIDE: 105 meq/L (ref 96–112)
CO2: 29 mEq/L (ref 19–32)
CREATININE: 0.9 mg/dL (ref 0.4–1.2)
Calcium: 9.8 mg/dL (ref 8.4–10.5)
GFR: 64.99 mL/min (ref >60.00)
Glucose, Bld: 98 mg/dL (ref 70–99)
Potassium: 3.6 mEq/L (ref 3.5–5.1)
Sodium: 141 mEq/L (ref 135–145)
Total Bilirubin: 1.3 mg/dL — ABNORMAL HIGH (ref 0.2–1.2)
Total Protein: 7.5 g/dL (ref 6.0–8.3)

## 2014-01-17 LAB — TSH: TSH: 0.18 u[IU]/mL — ABNORMAL LOW (ref 0.35–4.50)

## 2014-01-17 LAB — LIPID PANEL
CHOL/HDL RATIO: 4
Cholesterol: 232 mg/dL — ABNORMAL HIGH (ref 0–200)
HDL: 56.6 mg/dL (ref >39.00)
LDL CALC: 153 mg/dL — AB (ref 0–99)
NONHDL: 175.4
Triglycerides: 114 mg/dL (ref 0.0–149.0)
VLDL: 22.8 mg/dL (ref 0.0–40.0)

## 2014-01-17 LAB — URIC ACID: URIC ACID, SERUM: 6.6 mg/dL (ref 2.4–7.0)

## 2014-01-17 LAB — VITAMIN D 25 HYDROXY (VIT D DEFICIENCY, FRACTURES): VITD: 30.42 ng/mL (ref 30.00–100.00)

## 2014-01-17 LAB — T4, FREE: Free T4: 1.77 ng/dL — ABNORMAL HIGH (ref 0.60–1.60)

## 2014-01-20 ENCOUNTER — Ambulatory Visit (INDEPENDENT_AMBULATORY_CARE_PROVIDER_SITE_OTHER): Payer: Medicare Other | Admitting: Family Medicine

## 2014-01-20 ENCOUNTER — Encounter: Payer: Self-pay | Admitting: Family Medicine

## 2014-01-20 VITALS — BP 122/80 | HR 63 | Temp 98.8°F | Ht 65.5 in | Wt 152.5 lb

## 2014-01-20 DIAGNOSIS — E785 Hyperlipidemia, unspecified: Secondary | ICD-10-CM

## 2014-01-20 DIAGNOSIS — D751 Secondary polycythemia: Secondary | ICD-10-CM

## 2014-01-20 DIAGNOSIS — D72829 Elevated white blood cell count, unspecified: Secondary | ICD-10-CM | POA: Insufficient documentation

## 2014-01-20 DIAGNOSIS — Z0000 Encounter for general adult medical examination without abnormal findings: Secondary | ICD-10-CM

## 2014-01-20 NOTE — Progress Notes (Signed)
Pre visit review using our clinic review tool, if applicable. No additional management support is needed unless otherwise documented below in the visit note. 

## 2014-01-20 NOTE — Assessment & Plan Note (Signed)
Start red yeast rice.

## 2014-01-20 NOTE — Assessment & Plan Note (Signed)
Stable

## 2014-01-20 NOTE — Patient Instructions (Addendum)
Schedule lab only appt for recheck of cbc Continue current dose of thyroid med. Start red yeast rice 600 mg two tabs twice a day. If you start red yeast rice.. Schedule lab for cholesterol in 3 months. Look into prevnar, call if interested.

## 2014-01-20 NOTE — Progress Notes (Signed)
HPI  I have personally reviewed the Medicare Annual Wellness questionnaire and have noted  1. The patient's medical and social history  2. Their use of alcohol, tobacco or illicit drugs  3. Their current medications and supplements  4. The patient's functional ability including ADL's, fall risks, home safety risks and hearing or visual  impairment.  5. Diet and physical activities  6. Evidence for depression or mood disorders  The patients weight, height, BMI and visual acuity have been recorded in the chart  I have made referrals, counseling and provided education to the patient based review of the above and I have provided the pt with a written personalized care plan for preventive services.    Hypertension: Well controlled on tenormin and norvasc.   BP Readings from Last 3 Encounters:  01/20/14 122/80  04/08/13 141/89  04/05/13 132/80  Using medication without problems or lightheadedness: None  Chest pain with exertion: none  Edema:None  Short of breath:None  Average home BPs: well controlled daily when she checks it.  Other issues:   Elevated Cholesterol: Inadequate control. Refuses medication to treat.  Diet compliance: Moderate  Exercise:Walking some  Other complaints:  Lab Results  Component Value Date   CHOL 232* 01/17/2014   HDL 56.60 01/17/2014   LDLCALC 153* 01/17/2014   LDLDIRECT 181.2 12/02/2012   TRIG 114.0 01/17/2014   CHOLHDL 4 01/17/2014    Hypothyroid: stable on Synthroid. She has no jitteriness, no abn weight lss, no anxiety. She wants to continue current dose given well controlled. Lab Results  Component Value Date   TSH 0.18* 01/17/2014    Gout: Uric acid 6.6 on allopurinol 100 mg daily.  No flares in last year.  High calcium: stable.  Lab Results  Component Value Date   CALCIUM 9.8 01/17/2014   Prediabetes: improved. CBG 98  Felling well overall. No bacterial infection. Unclear why wbc up with slight left shift.  History of prominent thoracic  aorta.. nml ECHO.   Review of Systems  Constitutional: Negative for fatigue. Negative for fever.  HENT: Negative for ear pain.  Eyes: Negative for pain.  Respiratory: Negative for chest tightness and shortness of breath.  Cardiovascular: Negative for chest pain, palpitations and leg swelling.  Gastrointestinal: Negative for abdominal pain. No BM issues  Genitourinary: Negative for dysuria.  Normal mood.  Objective:   Physical Exam  Constitutional: Vital signs are normal. She appears well-developed and well-nourished. She is cooperative. Non-toxic appearance. She does not appear ill. No distress.  HENT:  Head: Normocephalic.  Right Ear: Hearing, tympanic membrane, external ear and ear canal normal.  Left Ear: Hearing, tympanic membrane, external ear and ear canal normal.  Nose: Nose normal.  Eyes: Conjunctivae, EOM and lids are normal. Pupils are equal, round, and reactive to light. No foreign bodies found.  Neck: Trachea normal and normal range of motion. Neck supple. Carotid bruit is not present. No mass and no thyromegaly present.  Cardiovascular: Normal rate, regular rhythm, S1 normal, S2 normal, normal heart sounds and intact distal pulses. Exam reveals no gallop.  No murmur heard.  Pulmonary/Chest: Effort normal and breath sounds normal. No respiratory distress. She has no wheezes. She has no rhonchi. She has no rales.  Abdominal: Soft. Normal appearance and bowel sounds are normal. She exhibits no distension, no fluid wave, no abdominal bruit and no mass. There is no hepatosplenomegaly. There is no tenderness. There is no rebound, no guarding and no CVA tenderness. No hernia.  Genitourinary: Not performed. Breast exam:  No mass B. No nipple changes. Lymphadenopathy:  She has no cervical adenopathy.  She has no axillary adenopathy.  Neurological: She is alert. She has normal strength. No cranial nerve deficit or sensory deficit.  Skin: Skin is warm, dry and intact. No rash noted.   Psychiatric: Her speech is normal and behavior is normal. Judgment normal. Her mood appears not anxious. Cognition and memory are normal. She does not exhibit a depressed mood.  Assessment & Plan:   Annual Medicare Wellness: The patient's preventative maintenance and recommended screening tests for an annual wellness exam were reviewed in full today.  Brought up to date unless services declined.  Counselled on the importance of diet, exercise, and its role in overall health and mortality.  The patient's FH and SH was reviewed, including their home life, tobacco status, and drug and alcohol status.   PAP/DVE: Not indicated, No family history of uterine or ovarian cancer, asymptomatic. She requests every 2 years. Bone density: 04/2011 osteopenia in hip, nml in spine, plan repeat in 5 years.  Up to Date with vaccines, not interested in shingles. Will consider prevnar. Colon in 2014 , polyps plan repeat in 5 years. Dr.Stark.  Mammo: 05/2013, have schedule in fall.

## 2014-01-21 NOTE — Addendum Note (Signed)
Addended by: Ellamae Sia on: 01/21/2014 09:20 AM   Modules accepted: Orders

## 2014-01-31 ENCOUNTER — Other Ambulatory Visit (INDEPENDENT_AMBULATORY_CARE_PROVIDER_SITE_OTHER): Payer: Medicare Other

## 2014-01-31 DIAGNOSIS — E039 Hypothyroidism, unspecified: Secondary | ICD-10-CM

## 2014-01-31 DIAGNOSIS — D72829 Elevated white blood cell count, unspecified: Secondary | ICD-10-CM

## 2014-01-31 LAB — CBC WITH DIFFERENTIAL/PLATELET
BASOS PCT: 0.5 % (ref 0.0–3.0)
Basophils Absolute: 0 10*3/uL (ref 0.0–0.1)
EOS ABS: 0.2 10*3/uL (ref 0.0–0.7)
EOS PCT: 1.5 % (ref 0.0–5.0)
HCT: 48.3 % — ABNORMAL HIGH (ref 36.0–46.0)
HEMOGLOBIN: 16.4 g/dL — AB (ref 12.0–15.0)
LYMPHS PCT: 22.5 % (ref 12.0–46.0)
Lymphs Abs: 2.4 10*3/uL (ref 0.7–4.0)
MCHC: 33.9 g/dL (ref 30.0–36.0)
MCV: 90.2 fl (ref 78.0–100.0)
MONOS PCT: 5.9 % (ref 3.0–12.0)
Monocytes Absolute: 0.6 10*3/uL (ref 0.1–1.0)
Neutro Abs: 7.3 10*3/uL (ref 1.4–7.7)
Neutrophils Relative %: 69.6 % (ref 43.0–77.0)
Platelets: 285 10*3/uL (ref 150.0–400.0)
RBC: 5.35 Mil/uL — ABNORMAL HIGH (ref 3.87–5.11)
RDW: 13.6 % (ref 11.5–15.5)
WBC: 10.5 10*3/uL (ref 4.0–10.5)

## 2014-01-31 NOTE — Addendum Note (Signed)
Addended by: Ellamae Sia on: 01/31/2014 11:36 AM   Modules accepted: Orders

## 2014-02-09 ENCOUNTER — Other Ambulatory Visit: Payer: Self-pay | Admitting: Family Medicine

## 2014-02-24 ENCOUNTER — Encounter: Payer: Self-pay | Admitting: Gastroenterology

## 2014-03-28 ENCOUNTER — Other Ambulatory Visit: Payer: Self-pay | Admitting: Family Medicine

## 2014-03-29 ENCOUNTER — Ambulatory Visit (INDEPENDENT_AMBULATORY_CARE_PROVIDER_SITE_OTHER): Payer: Medicare Other

## 2014-03-29 DIAGNOSIS — Z23 Encounter for immunization: Secondary | ICD-10-CM

## 2014-04-30 ENCOUNTER — Other Ambulatory Visit: Payer: Self-pay | Admitting: Family Medicine

## 2014-05-09 ENCOUNTER — Other Ambulatory Visit: Payer: Self-pay | Admitting: Family Medicine

## 2014-07-11 ENCOUNTER — Telehealth: Payer: Self-pay | Admitting: *Deleted

## 2014-07-11 NOTE — Telephone Encounter (Signed)
Received fax from [facility] for PA on Synthroid 125 mcg.  Forms completed and faxed back to [phone].

## 2014-07-14 NOTE — Telephone Encounter (Signed)
Synthroid approved through 07/12/2015.

## 2014-08-30 ENCOUNTER — Ambulatory Visit (INDEPENDENT_AMBULATORY_CARE_PROVIDER_SITE_OTHER): Payer: Medicare Other | Admitting: Family Medicine

## 2014-08-30 ENCOUNTER — Encounter: Payer: Self-pay | Admitting: Family Medicine

## 2014-08-30 VITALS — BP 110/80 | HR 60 | Temp 98.6°F | Ht 65.5 in | Wt 150.2 lb

## 2014-08-30 DIAGNOSIS — R413 Other amnesia: Secondary | ICD-10-CM | POA: Diagnosis not present

## 2014-08-30 DIAGNOSIS — F03C0 Unspecified dementia, severe, without behavioral disturbance, psychotic disturbance, mood disturbance, and anxiety: Secondary | ICD-10-CM | POA: Insufficient documentation

## 2014-08-30 DIAGNOSIS — F0390 Unspecified dementia without behavioral disturbance: Secondary | ICD-10-CM | POA: Insufficient documentation

## 2014-08-30 LAB — COMPREHENSIVE METABOLIC PANEL
ALT: 10 U/L (ref 0–35)
AST: 15 U/L (ref 0–37)
Albumin: 4.1 g/dL (ref 3.5–5.2)
Alkaline Phosphatase: 57 U/L (ref 39–117)
BUN: 15 mg/dL (ref 6–23)
CO2: 31 mEq/L (ref 19–32)
Calcium: 10 mg/dL (ref 8.4–10.5)
Chloride: 101 mEq/L (ref 96–112)
Creatinine, Ser: 0.92 mg/dL (ref 0.40–1.20)
GFR: 63.25 mL/min (ref >60.00)
Glucose, Bld: 102 mg/dL — ABNORMAL HIGH (ref 70–99)
POTASSIUM: 3.3 meq/L — AB (ref 3.5–5.1)
Sodium: 138 mEq/L (ref 135–145)
TOTAL PROTEIN: 7.2 g/dL (ref 6.0–8.3)
Total Bilirubin: 1 mg/dL (ref 0.2–1.2)

## 2014-08-30 LAB — POCT URINALYSIS DIPSTICK
Bilirubin, UA: NEGATIVE
Blood, UA: NEGATIVE
GLUCOSE UA: NEGATIVE
NITRITE UA: NEGATIVE
PH UA: 5.5
UROBILINOGEN UA: 0.2

## 2014-08-30 LAB — CBC WITH DIFFERENTIAL/PLATELET
BASOS ABS: 0 10*3/uL (ref 0.0–0.1)
Basophils Relative: 0.4 % (ref 0.0–3.0)
Eosinophils Absolute: 0.2 10*3/uL (ref 0.0–0.7)
Eosinophils Relative: 1.5 % (ref 0.0–5.0)
HCT: 48.8 % — ABNORMAL HIGH (ref 36.0–46.0)
HEMOGLOBIN: 16.4 g/dL — AB (ref 12.0–15.0)
LYMPHS ABS: 2.6 10*3/uL (ref 0.7–4.0)
Lymphocytes Relative: 21.4 % (ref 12.0–46.0)
MCHC: 33.6 g/dL (ref 30.0–36.0)
MCV: 89.1 fl (ref 78.0–100.0)
Monocytes Absolute: 0.9 10*3/uL (ref 0.1–1.0)
Monocytes Relative: 7.2 % (ref 3.0–12.0)
NEUTROS ABS: 8.3 10*3/uL — AB (ref 1.4–7.7)
Neutrophils Relative %: 69.5 % (ref 43.0–77.0)
Platelets: 314 10*3/uL (ref 150.0–400.0)
RBC: 5.48 Mil/uL — ABNORMAL HIGH (ref 3.87–5.11)
RDW: 13.2 % (ref 11.5–15.5)
WBC: 11.9 10*3/uL — ABNORMAL HIGH (ref 4.0–10.5)

## 2014-08-30 LAB — TSH: TSH: 0.02 u[IU]/mL — ABNORMAL LOW (ref 0.35–4.50)

## 2014-08-30 LAB — T4, FREE: Free T4: 2.64 ng/dL — ABNORMAL HIGH (ref 0.60–1.60)

## 2014-08-30 LAB — VITAMIN B12: Vitamin B-12: 165 pg/mL — ABNORMAL LOW (ref 211–911)

## 2014-08-30 LAB — VITAMIN D 25 HYDROXY (VIT D DEFICIENCY, FRACTURES): VITD: 21.35 ng/mL — ABNORMAL LOW (ref 30.00–100.00)

## 2014-08-30 LAB — T3, FREE: T3 FREE: 3.7 pg/mL (ref 2.3–4.2)

## 2014-08-30 NOTE — Addendum Note (Signed)
Addended by: Carter Kitten on: 08/30/2014 02:21 PM   Modules accepted: Orders

## 2014-08-30 NOTE — Patient Instructions (Signed)
Stop at lab on way p[address]. We will call with results.

## 2014-08-30 NOTE — Progress Notes (Signed)
Pre visit review using our clinic review tool, if applicable. No additional management support is needed unless otherwise documented below in the visit note. 

## 2014-08-30 NOTE — Progress Notes (Signed)
Subjective:    Patient ID: Gloria Lloyd, female    DOB: 05-Feb-1941, 74 y.o.   MRN: [id number]  HPI 74 year old female  with history of hypothyroidism, HTN presents  for evaluation of memory issues.  Her husband states that in the last 4-6 months she has had worsening short term memory. Issues with remembering ex. Just made a sandwich, forgetting visiting kids, cannot remember PIN number. Her daily activities are fine,? if remembering meds.  Her daughter states she has been more quiet lately.  Longterm memory is great.  Recently good friend in last 6 months has been diagnosed with cancer. The patient denies depression/anxiety. She is on her mind a lot and she is tearful.  No dysuria.  No fever. No headaches, no weakness, no slurred speech,   Family history: NO alzheimer's , no parkinson's    Review of Systems  Constitutional: Negative for fever and fatigue.  HENT: Negative for ear pain.   Eyes: Negative for pain.  Respiratory: Negative for chest tightness and shortness of breath.   Cardiovascular: Negative for chest pain, palpitations and leg swelling.  Gastrointestinal: Negative for abdominal pain.  Genitourinary: Negative for dysuria.   MMSE: 28/30 Decreased recall animal test 6 out of 15.Michela Pitcher cow 3 times..    Objective:   Physical Exam  Constitutional: She is oriented to person, place, and time. Vital signs are normal. She appears well-developed and well-nourished. She is cooperative.  Non-toxic appearance. She does not appear ill. No distress.  HENT:  Head: Normocephalic.  Right Ear: Hearing, tympanic membrane, external ear and ear canal normal. Tympanic membrane is not erythematous, not retracted and not bulging.  Left Ear: Hearing, tympanic membrane, external ear and ear canal normal. Tympanic membrane is not erythematous, not retracted and not bulging.  Nose: No mucosal edema or rhinorrhea. Right sinus exhibits no maxillary sinus tenderness and no frontal sinus  tenderness. Left sinus exhibits no maxillary sinus tenderness and no frontal sinus tenderness.  Mouth/Throat: Uvula is midline, oropharynx is clear and moist and mucous membranes are normal.  Eyes: Conjunctivae, EOM and lids are normal. Pupils are equal, round, and reactive to light. Lids are everted and swept, no foreign bodies found.  Neck: Trachea normal and normal range of motion. Neck supple. Carotid bruit is not present. No thyroid mass and no thyromegaly present.  Cardiovascular: Normal rate, regular rhythm, S1 normal, S2 normal, normal heart sounds, intact distal pulses and normal pulses.  Exam reveals no gallop and no friction rub.   No murmur heard. Pulmonary/Chest: Effort normal and breath sounds normal. No tachypnea. No respiratory distress. She has no decreased breath sounds. She has no wheezes. She has no rhonchi. She has no rales.  Abdominal: Soft. Normal appearance and bowel sounds are normal. There is no tenderness.  Neurological: She is alert and oriented to person, place, and time. She has normal strength and normal reflexes. No cranial nerve deficit or sensory deficit. She exhibits normal muscle tone. She displays a negative Romberg sign. Coordination and gait normal. GCS eye subscore is 4. GCS verbal subscore is 5. GCS motor subscore is 6.  Nml cerebellar exam   No papilledema  Skin: Skin is warm, dry and intact. No rash noted.  Psychiatric: She has a normal mood and affect. Her speech is normal and behavior is normal. Judgment and thought content normal. Her mood appears not anxious. Cognition and memory are normal. Cognition and memory are not impaired. She does not exhibit a depressed mood. She  exhibits normal recent memory and normal remote memory.          Assessment & Plan:

## 2014-08-30 NOTE — Assessment & Plan Note (Signed)
Will eval with UA and labs to assess for secondary cause.  Consider cognitive psych eval for pseudodementia from mood issue as well as MRI.

## 2014-09-01 LAB — URINE CULTURE: Colony Count: 40000

## 2014-09-05 ENCOUNTER — Telehealth: Payer: Self-pay | Admitting: Family Medicine

## 2014-09-05 ENCOUNTER — Encounter: Payer: Self-pay | Admitting: *Deleted

## 2014-09-05 MED ORDER — LEVOTHYROXINE SODIUM 112 MCG PO TABS: 112.0000 ug | ORAL_TABLET | Freq: Every day | ORAL | Status: DC

## 2014-09-05 MED ORDER — VITAMIN B-12 1000 MCG PO TABS: 1000.0000 ug | ORAL_TABLET | Freq: Every day | ORAL | Status: DC

## 2014-09-05 MED ORDER — VITAMIN D (ERGOCALCIFEROL) 1.25 MG (50000 UNIT) PO CAPS: 50000.0000 [IU] | ORAL_CAPSULE | ORAL | Status: DC

## 2014-09-05 NOTE — Telephone Encounter (Signed)
-----   Message from Carter Kitten, [place] sent at 09/05/2014 10:20 AM EDT ----- Mrs. Blick notified as instructed by telephone.  She is agreeable with you sending in prescriptions for lower dose of levothyroxine, Vit D and Vit B12.  She also requested that I send her a letter with results and instructions.  Letter mailed as requested.

## 2014-10-05 ENCOUNTER — Telehealth (INDEPENDENT_AMBULATORY_CARE_PROVIDER_SITE_OTHER): Payer: Medicare Other | Admitting: Family Medicine

## 2014-10-05 ENCOUNTER — Other Ambulatory Visit (INDEPENDENT_AMBULATORY_CARE_PROVIDER_SITE_OTHER): Payer: Medicare Other

## 2014-10-05 DIAGNOSIS — E039 Hypothyroidism, unspecified: Secondary | ICD-10-CM

## 2014-10-05 LAB — T3, FREE: T3 FREE: 2.9 pg/mL (ref 2.3–4.2)

## 2014-10-05 LAB — TSH: TSH: 0.05 u[IU]/mL — ABNORMAL LOW (ref 0.35–4.50)

## 2014-10-05 LAB — T4, FREE: Free T4: 1.5 ng/dL (ref 0.60–1.60)

## 2014-10-05 NOTE — Telephone Encounter (Signed)
-----   Message from Ellamae Sia sent at 10/04/2014  5:28 PM EDT ----- Regarding: Lab orders for Wednesday, 4.27.16 Lab orders, no f/u appt

## 2014-10-06 MED ORDER — LEVOTHYROXINE SODIUM 112 MCG PO TABS: 112.0000 ug | ORAL_TABLET | Freq: Every day | ORAL | Status: DC

## 2014-10-06 NOTE — Telephone Encounter (Signed)
Mr. Gloria Lloyd requested a 90 day supply of Levothyroxine be sent to [facility].

## 2014-10-06 NOTE — Addendum Note (Signed)
Addended by: Carter Kitten on: 10/06/2014 12:52 PM   Modules accepted: Orders

## 2014-10-13 ENCOUNTER — Other Ambulatory Visit: Payer: Self-pay | Admitting: Family Medicine

## 2014-12-22 ENCOUNTER — Other Ambulatory Visit: Payer: Self-pay | Admitting: Family Medicine

## 2014-12-22 NOTE — Telephone Encounter (Signed)
Please call and schedule Medicare Wellness with fasting labs only for sometime after 01/21/2015 with Dr. Diona Browner.

## 2015-02-07 ENCOUNTER — Encounter: Payer: Self-pay | Admitting: Family Medicine

## 2015-02-07 ENCOUNTER — Ambulatory Visit (INDEPENDENT_AMBULATORY_CARE_PROVIDER_SITE_OTHER): Payer: Medicare Other | Admitting: Family Medicine

## 2015-02-07 VITALS — BP 122/86 | HR 72 | Temp 98.6°F | Ht 65.5 in | Wt 152.2 lb

## 2015-02-07 DIAGNOSIS — R413 Other amnesia: Secondary | ICD-10-CM

## 2015-02-07 MED ORDER — DONEPEZIL HCL 5 MG PO TABS: 5.0000 mg | ORAL_TABLET | Freq: Every day | ORAL | Status: DC

## 2015-02-07 NOTE — Assessment & Plan Note (Addendum)
Set up for MRI to eval. Denies depression.  Will start aricept 5 mg daily, follow up in 1-2 months for re-eval.  Spent 25 min total face to face with [pt in eval, and discussion of care, possible diagnosis, answering questions etc.

## 2015-02-07 NOTE — Patient Instructions (Addendum)
Stop at front desk to set up MRI brain.  Start aricept 5 mg daily.

## 2015-02-07 NOTE — Progress Notes (Signed)
Subjective:    Patient ID: Gloria Lloyd, female    DOB: 08-02-1940, 74 y.o.   MRN: [id number]  HPI  74 year old female presents for follow up on memory loss with her 2 daughters and husband.  At last OV in 08/2014  She had noted 4-6 month of worsening short term memory. Had lost a friend to cancer.  MMSE: 28/30 Decreased recall animal test 6 out of 15.Michela Pitcher cow 3 times..  U culture  was clear Vit D and Vit B12 were low: supplemented. TSH was low at 0.02, high t4 , nml t3.. Levothyroxine was decreased to 112 mcg daily.  TSH on recheck 4 weeks later was still low but free t3 and free t4 were in nml range.  She and her family  reports her memory gradually worsening.   Has had episodes of not remembering events, purchases.  She watches TV, reads newspaper. Some one has to given her her pills, forgets she took 15 min later.  Daughters took her to the beach.. Memory got worse in different setting, later in afternoons more agitation. More angry at daughters.  She feels she does not have depression. Family and pt do not endorse depression.   She has stopped her omeprazole, allopurinol. She does not use colchicine, elocon cream and premarin vaginal cream any longer.  Hypertension:   She takes her BP meds   BP remains fairly well controlled. BP Readings from Last 3 Encounters:  02/07/15 122/86  08/30/14 110/80  01/20/14 122/80   Using medication without problems or lightheadedness:  Chest pain with exertion: Edema: Short of breath: Average home BPs: Other issues:   Family history: NO alzheimer's , no parkinson's Review of Systems  Constitutional: Negative for fever and fatigue.  HENT: Negative for ear pain.   Eyes: Negative for pain.  Respiratory: Negative for chest tightness and shortness of breath.   Cardiovascular: Negative for chest pain, palpitations and leg swelling.  Gastrointestinal: Negative for abdominal pain.  Genitourinary: Negative for dysuria.         Objective:   Physical Exam  Constitutional: She is oriented to person, place, and time. Vital signs are normal. She appears well-developed and well-nourished. She is cooperative.  Non-toxic appearance. She does not appear ill. No distress.  HENT:  Head: Normocephalic.  Right Ear: Hearing, tympanic membrane, external ear and ear canal normal. Tympanic membrane is not erythematous, not retracted and not bulging.  Left Ear: Hearing, tympanic membrane, external ear and ear canal normal. Tympanic membrane is not erythematous, not retracted and not bulging.  Nose: No mucosal edema or rhinorrhea. Right sinus exhibits no maxillary sinus tenderness and no frontal sinus tenderness. Left sinus exhibits no maxillary sinus tenderness and no frontal sinus tenderness.  Mouth/Throat: Uvula is midline, oropharynx is clear and moist and mucous membranes are normal.  Eyes: Conjunctivae, EOM and lids are normal. Pupils are equal, round, and reactive to light. Lids are everted and swept, no foreign bodies found.  Neck: Trachea normal and normal range of motion. Neck supple. Carotid bruit is not present. No thyroid mass and no thyromegaly present.  Cardiovascular: Normal rate, regular rhythm, S1 normal, S2 normal, normal heart sounds, intact distal pulses and normal pulses.  Exam reveals no gallop and no friction rub.   No murmur heard. Pulmonary/Chest: Effort normal and breath sounds normal. No tachypnea. No respiratory distress. She has no decreased breath sounds. She has no wheezes. She has no rhonchi. She has no rales.  Abdominal: Soft. Normal  appearance and bowel sounds are normal. There is no tenderness.  Neurological: She is alert and oriented to person, place, and time. She has normal strength and normal reflexes. No cranial nerve deficit or sensory deficit. She exhibits normal muscle tone. She displays a negative Romberg sign. Coordination and gait normal. GCS eye subscore is 4. GCS verbal subscore is 5. GCS  motor subscore is 6.  Nml cerebellar exam   No papilledema  Skin: Skin is warm, dry and intact. No rash noted.  Psychiatric: She has a normal mood and affect. Her speech is normal and behavior is normal. Judgment and thought content normal. Her mood appears not anxious. Cognition and memory are normal. Cognition and memory are not impaired. She does not exhibit a depressed mood. She exhibits normal recent memory and normal remote memory.          Assessment & Plan:

## 2015-02-07 NOTE — Progress Notes (Signed)
Pre visit review using our clinic review tool, if applicable. No additional management support is needed unless otherwise documented below in the visit note. 

## 2015-02-14 ENCOUNTER — Ambulatory Visit
Admission: RE | Admit: 2015-02-14 | Discharge: 2015-02-14 | Disposition: A | Discharge status: Home / Self Care | Payer: Medicare Other | Source: Ambulatory Visit | Attending: Family Medicine | Admitting: Family Medicine

## 2015-02-14 DIAGNOSIS — R413 Other amnesia: Secondary | ICD-10-CM

## 2015-03-16 ENCOUNTER — Ambulatory Visit: Payer: Medicare Other | Admitting: Family Medicine

## 2015-03-16 ENCOUNTER — Ambulatory Visit (INDEPENDENT_AMBULATORY_CARE_PROVIDER_SITE_OTHER): Payer: Medicare Other | Admitting: Family Medicine

## 2015-03-16 ENCOUNTER — Other Ambulatory Visit: Payer: Self-pay | Admitting: Family Medicine

## 2015-03-16 VITALS — BP 124/90 | HR 81 | Temp 98.2°F | Ht 65.5 in | Wt 152.8 lb

## 2015-03-16 DIAGNOSIS — F0390 Unspecified dementia without behavioral disturbance: Secondary | ICD-10-CM

## 2015-03-16 DIAGNOSIS — Z23 Encounter for immunization: Secondary | ICD-10-CM

## 2015-03-16 DIAGNOSIS — F03B0 Unspecified dementia, moderate, without behavioral disturbance, psychotic disturbance, mood disturbance, and anxiety: Secondary | ICD-10-CM

## 2015-03-16 MED ORDER — DONEPEZIL HCL 10 MG PO TABS
10.0000 mg | ORAL_TABLET | Freq: Every day | ORAL | Status: DC
Start: 2015-03-16 — End: 2016-02-13

## 2015-03-16 NOTE — Patient Instructions (Signed)
Increase aricept to 10 mg daily.

## 2015-03-16 NOTE — Assessment & Plan Note (Addendum)
Worsening MMSE in last 1-2 months. Increase aricept to 10 mg daily.  Encouraged routine to help with confusion.

## 2015-03-16 NOTE — Progress Notes (Signed)
   Subjective:    Patient ID: Gloria Lloyd, female    DOB: [birth year], 74 y.o.   MRN: [id number]  HPI  74 year old female presents for follow up dementia. Here with husband and daughter.  Last OV 02/07/2015: MRI brain: unrevealing Started on Aricept 5 mg daily.  She reports no SE to the med.  Family reports she is about the same with her memory.  Wt Readings from Last 3 Encounters:  03/16/15 152 lb 12 oz (69.287 kg)  02/07/15 152 lb 4 oz (69.06 kg)  08/30/14 150 lb 4 oz (68.153 kg)         Review of Systems  Constitutional: Negative for fever and fatigue.  HENT: Negative for ear pain.   Eyes: Negative for pain.  Respiratory: Negative for chest tightness and shortness of breath.   Cardiovascular: Negative for chest pain, palpitations and leg swelling.  Gastrointestinal: Negative for abdominal pain.  Genitourinary: Negative for dysuria.       Objective:   Physical Exam  Constitutional: She is oriented to person, place, and time. Vital signs are normal. She appears well-developed and well-nourished. She is cooperative.  Non-toxic appearance. She does not appear ill. No distress.  HENT:  Head: Normocephalic.  Right Ear: Hearing, tympanic membrane, external ear and ear canal normal. Tympanic membrane is not erythematous, not retracted and not bulging.  Left Ear: Hearing, tympanic membrane, external ear and ear canal normal. Tympanic membrane is not erythematous, not retracted and not bulging.  Nose: No mucosal edema or rhinorrhea. Right sinus exhibits no maxillary sinus tenderness and no frontal sinus tenderness. Left sinus exhibits no maxillary sinus tenderness and no frontal sinus tenderness.  Mouth/Throat: Uvula is midline, oropharynx is clear and moist and mucous membranes are normal.  Eyes: Conjunctivae, EOM and lids are normal. Pupils are equal, round, and reactive to light. Lids are everted and swept, no foreign bodies found.  Neck: Trachea normal and normal range of  motion. Neck supple. Carotid bruit is not present. No thyroid mass and no thyromegaly present.  Cardiovascular: Normal rate, regular rhythm, S1 normal, S2 normal, normal heart sounds, intact distal pulses and normal pulses.  Exam reveals no gallop and no friction rub.   No murmur heard. Pulmonary/Chest: Effort normal and breath sounds normal. No tachypnea. No respiratory distress. She has no decreased breath sounds. She has no wheezes. She has no rhonchi. She has no rales.  Abdominal: Soft. Normal appearance and bowel sounds are normal. There is no tenderness.  Neurological: She is alert and oriented to person, place, and time. She has normal strength and normal reflexes. No cranial nerve deficit or sensory deficit. She exhibits normal muscle tone. She displays a negative Romberg sign. Coordination and gait normal. GCS eye subscore is 4. GCS verbal subscore is 5. GCS motor subscore is 6.  Nml cerebellar exam   No papilledema  Skin: Skin is warm, dry and intact. No rash noted.  Psychiatric: She has a normal mood and affect. Her speech is normal and behavior is normal. Judgment and thought content normal. Her mood appears not anxious. Cognition and memory are normal. Cognition and memory are not impaired. She does not exhibit a depressed mood. She exhibits normal recent memory and normal remote memory.  Very happy pleasant laughing    22/30       Assessment & Plan:

## 2015-03-16 NOTE — Progress Notes (Signed)
Pre visit review using our clinic review tool, if applicable. No additional management support is needed unless otherwise documented below in the visit note. 

## 2015-04-03 ENCOUNTER — Other Ambulatory Visit: Payer: Self-pay | Admitting: Family Medicine

## 2015-05-16 ENCOUNTER — Telehealth: Payer: Self-pay | Admitting: Family Medicine

## 2015-05-16 DIAGNOSIS — D751 Secondary polycythemia: Secondary | ICD-10-CM

## 2015-05-16 DIAGNOSIS — E785 Hyperlipidemia, unspecified: Secondary | ICD-10-CM

## 2015-05-16 DIAGNOSIS — M109 Gout, unspecified: Secondary | ICD-10-CM

## 2015-05-16 DIAGNOSIS — M8580 Other specified disorders of bone density and structure, unspecified site: Secondary | ICD-10-CM

## 2015-05-16 DIAGNOSIS — E039 Hypothyroidism, unspecified: Secondary | ICD-10-CM

## 2015-05-16 DIAGNOSIS — D72829 Elevated white blood cell count, unspecified: Secondary | ICD-10-CM

## 2015-05-16 NOTE — Telephone Encounter (Signed)
-----   Message from Ellamae Sia sent at 05/16/2015 11:14 AM EST ----- Regarding: Lab orders for Friday, 12.9.16 Patient is scheduled for CPX labs, please order future labs, Thanks , Karna Christmas

## 2015-05-19 ENCOUNTER — Other Ambulatory Visit (INDEPENDENT_AMBULATORY_CARE_PROVIDER_SITE_OTHER): Payer: Medicare Other

## 2015-05-19 DIAGNOSIS — E785 Hyperlipidemia, unspecified: Secondary | ICD-10-CM

## 2015-05-19 DIAGNOSIS — D751 Secondary polycythemia: Secondary | ICD-10-CM

## 2015-05-19 DIAGNOSIS — M109 Gout, unspecified: Secondary | ICD-10-CM

## 2015-05-19 LAB — CBC WITH DIFFERENTIAL/PLATELET
BASOS ABS: 0.1 10*3/uL (ref 0.0–0.1)
Basophils Relative: 0.6 % (ref 0.0–3.0)
EOS ABS: 0.2 10*3/uL (ref 0.0–0.7)
Eosinophils Relative: 2.2 % (ref 0.0–5.0)
HEMATOCRIT: 50 % — AB (ref 36.0–46.0)
HEMOGLOBIN: 16.5 g/dL — AB (ref 12.0–15.0)
Lymphocytes Relative: 21.8 % (ref 12.0–46.0)
Lymphs Abs: 2.1 10*3/uL (ref 0.7–4.0)
MCHC: 33.1 g/dL (ref 30.0–36.0)
MCV: 90 fl (ref 78.0–100.0)
MONOS PCT: 7 % (ref 3.0–12.0)
Monocytes Absolute: 0.7 10*3/uL (ref 0.1–1.0)
Neutro Abs: 6.6 10*3/uL (ref 1.4–7.7)
Neutrophils Relative %: 68.4 % (ref 43.0–77.0)
Platelets: 287 10*3/uL (ref 150.0–400.0)
RBC: 5.55 Mil/uL — ABNORMAL HIGH (ref 3.87–5.11)
RDW: 13.3 % (ref 11.5–15.5)
WBC: 9.6 10*3/uL (ref 4.0–10.5)

## 2015-05-19 LAB — COMPREHENSIVE METABOLIC PANEL
ALT: 11 U/L (ref 0–35)
AST: 14 U/L (ref 0–37)
Albumin: 4.1 g/dL (ref 3.5–5.2)
Alkaline Phosphatase: 50 U/L (ref 39–117)
BILIRUBIN TOTAL: 1 mg/dL (ref 0.2–1.2)
BUN: 11 mg/dL (ref 6–23)
CHLORIDE: 103 meq/L (ref 96–112)
CO2: 32 meq/L (ref 19–32)
CREATININE: 0.79 mg/dL (ref 0.40–1.20)
Calcium: 9.7 mg/dL (ref 8.4–10.5)
GFR: 75.26 mL/min (ref >60.00)
GLUCOSE: 97 mg/dL (ref 70–99)
Potassium: 3.9 mEq/L (ref 3.5–5.1)
Sodium: 144 mEq/L (ref 135–145)
Total Protein: 7.1 g/dL (ref 6.0–8.3)

## 2015-05-19 LAB — LIPID PANEL
Cholesterol: 224 mg/dL — ABNORMAL HIGH (ref 0–200)
HDL: 67 mg/dL (ref >39.00)
LDL Cholesterol: 129 mg/dL — ABNORMAL HIGH (ref 0–99)
NONHDL: 156.95
TRIGLYCERIDES: 142 mg/dL (ref 0.0–149.0)
Total CHOL/HDL Ratio: 3
VLDL: 28.4 mg/dL (ref 0.0–40.0)

## 2015-05-19 LAB — URIC ACID: URIC ACID, SERUM: 6.9 mg/dL (ref 2.4–7.0)

## 2015-05-23 ENCOUNTER — Encounter: Payer: Self-pay | Admitting: Family Medicine

## 2015-05-23 ENCOUNTER — Ambulatory Visit (INDEPENDENT_AMBULATORY_CARE_PROVIDER_SITE_OTHER): Payer: Medicare Other | Admitting: Family Medicine

## 2015-05-23 VITALS — BP 131/81 | HR 61 | Temp 98.0°F | Ht 65.5 in | Wt 144.8 lb

## 2015-05-23 DIAGNOSIS — M109 Gout, unspecified: Secondary | ICD-10-CM

## 2015-05-23 DIAGNOSIS — Z0000 Encounter for general adult medical examination without abnormal findings: Secondary | ICD-10-CM | POA: Diagnosis not present

## 2015-05-23 DIAGNOSIS — F0390 Unspecified dementia without behavioral disturbance: Secondary | ICD-10-CM

## 2015-05-23 DIAGNOSIS — D751 Secondary polycythemia: Secondary | ICD-10-CM | POA: Diagnosis not present

## 2015-05-23 DIAGNOSIS — I10 Essential (primary) hypertension: Secondary | ICD-10-CM

## 2015-05-23 DIAGNOSIS — Z23 Encounter for immunization: Secondary | ICD-10-CM | POA: Diagnosis not present

## 2015-05-23 DIAGNOSIS — Z7189 Other specified counseling: Secondary | ICD-10-CM | POA: Insufficient documentation

## 2015-05-23 DIAGNOSIS — F03B0 Unspecified dementia, moderate, without behavioral disturbance, psychotic disturbance, mood disturbance, and anxiety: Secondary | ICD-10-CM

## 2015-05-23 DIAGNOSIS — E785 Hyperlipidemia, unspecified: Secondary | ICD-10-CM | POA: Diagnosis not present

## 2015-05-23 NOTE — Patient Instructions (Signed)
Given prevnar pneumonia vaccine today.  Keep up with exercise and healthy eating.

## 2015-05-23 NOTE — Assessment & Plan Note (Signed)
Stable

## 2015-05-23 NOTE — Assessment & Plan Note (Signed)
Well controlled. Continue current medication.  

## 2015-05-23 NOTE — Progress Notes (Signed)
Pre visit review using our clinic review tool, if applicable. No additional management support is needed unless otherwise documented below in the visit note. 

## 2015-05-23 NOTE — Assessment & Plan Note (Addendum)
Stable on aricept . No SE but some noted weight loss. Will follow. No family in room  at [place] today.

## 2015-05-23 NOTE — Assessment & Plan Note (Signed)
No HCPOA, no living will per pt.. Will send home with EOL packet  (reviewed 2016)

## 2015-05-23 NOTE — Progress Notes (Signed)
HPI  I have personally reviewed the Medicare Annual Wellness questionnaire and have noted 1. The patient's medical and social history 2. Their use of alcohol, tobacco or illicit drugs 3. Their current medications and supplements 4. The patient's functional ability including ADL's, fall risks, home safety risks and hearing or visual             impairment. 5. Diet and physical activities 6. Evidence for depression or mood disorders 7.         Updated provider list Cognitive evaluation was performed and recorded on pt medicare questionnaire form. The patients weight, height, BMI and visual acuity have been recorded in the chart  I have made referrals, counseling and provided education to the patient based review of the above and I have provided the pt with a written personalized care plan for preventive services.     Dementia, moderate:  Now on aricept 10 mg for the last 2 months.  Stable per pt, no family with her.  No upset stomach.  Hypertension: Well controlled on tenormin and norvasc.  BP Readings from Last 3 Encounters:  05/23/15 131/81  03/16/15 124/90  02/07/15 122/86  Using medication without problems or lightheadedness: None  Chest pain with exertion: none  Edema:None  Short of breath:None  Average home BPs: well controlled daily when she checks it.  Other issues:   Elevated Cholesterol: LDL improved on no med. Now at goal LDL < 130./ Refuses medication to treat.  Diet compliance: Moderate  Exercise:Walking some  Other complaints:  Lab Results  Component Value Date   CHOL 224* 05/19/2015   HDL 67.00 05/19/2015   LDLCALC 129* 05/19/2015   LDLDIRECT 181.2 12/02/2012   TRIG 142.0 05/19/2015   CHOLHDL 3 05/19/2015   Wt Readings from Last 3 Encounters:  05/23/15 144 lb 12 oz (65.658 kg)  03/16/15 152 lb 12 oz (69.287 kg)  02/07/15 152 lb 4 oz (69.06 kg)    Hypothyroid: stable on Synthroid. She has no jitteriness, no abn weight lss, no anxiety. She  wants to continue current dose given well controlled. Lab Results  Component Value Date   TSH 0.05* 10/05/2014   Gout: Uric acid 6.6 on allopurinol 100 mg daily. No flares in last year. Uric acid 6.9  High calcium: nml now  Prediabetes: improved. CBG 97  History of prominent thoracic aorta.. nml ECHO.   Review of Systems  Constitutional: Negative for fatigue. Negative for fever.  HENT: Negative for ear pain.  Eyes: Negative for pain.  Respiratory: Negative for chest tightness and shortness of breath.  Cardiovascular: Negative for chest pain, palpitations and leg swelling.  Gastrointestinal: Negative for abdominal pain. No BM issues  Genitourinary: Negative for dysuria.  Normal mood.  Objective:   Physical Exam  Constitutional: Vital signs are normal. She appears well-developed and well-nourished. She is cooperative. Non-toxic appearance. She does not appear ill. No distress.  HENT:  Head: Normocephalic.  Right Ear: Hearing, tympanic membrane, external ear and ear canal normal.  Left Ear: Hearing, tympanic membrane, external ear and ear canal normal.  Nose: Nose normal.  Eyes: Conjunctivae, EOM and lids are normal. Pupils are equal, round, and reactive to light. No foreign bodies found.  Neck: Trachea normal and normal range of motion. Neck supple. Carotid bruit is not present. No mass and no thyromegaly present.  Cardiovascular: Normal rate, regular rhythm, S1 normal, S2 normal, normal heart sounds and intact distal pulses. Exam reveals no gallop.  No murmur heard.  Pulmonary/Chest: Effort normal  and breath sounds normal. No respiratory distress. She has no wheezes. She has no rhonchi. She has no rales.  Abdominal: Soft. Normal appearance and bowel sounds are normal. She exhibits no distension, no fluid wave, no abdominal bruit and no mass. There is no hepatosplenomegaly. There is no tenderness. There is no rebound, no guarding and no CVA tenderness. No hernia.   Genitourinary: Not performed. Breast exam: No mass B. No nipple changes. Lymphadenopathy:  She has no cervical adenopathy.  She has no axillary adenopathy.  Neurological: She is alert. She has normal strength. No cranial nerve deficit or sensory deficit.  Skin: Skin is warm, dry and intact. No rash noted.  Psychiatric: Her speech is normal and behavior is normal. Judgment normal. Her mood appears not anxious. Cognition and memory are normal. She does not exhibit a depressed mood.  Assessment & Plan:   Annual Medicare Wellness: The patient's preventative maintenance and recommended screening tests for an annual wellness exam were reviewed in full today.  Brought up to date unless services declined.  Counselled on the importance of diet, exercise, and its role in overall health and mortality.  The patient's FH and SH was reviewed, including their home life, tobacco status, and drug and alcohol status.   PAP/DVE: Not indicated, No family history of uterine or ovarian cancer, asymptomatic. She requests every 2 years. Bone density: 04/2011 osteopenia in hip, nml in spine, plan repeat in 5 years.  Up to Date with vaccines, not interested in shingles. Will give prevnar. Colon in 2014 , polyps plan repeat in 5 years. Dr.Stark.  Mammo: 05/2013, not indicated after age 72.

## 2015-05-23 NOTE — Assessment & Plan Note (Signed)
Uric acid at goal and no flares on low dose allopurinol.

## 2015-05-23 NOTE — Addendum Note (Signed)
Addended by: Carter Kitten on: 05/23/2015 09:17 AM   Modules accepted: Orders

## 2015-05-23 NOTE — Assessment & Plan Note (Signed)
Improved control with weight loss and healthy eating. NOw LDL at goal < 130.

## 2015-06-06 ENCOUNTER — Other Ambulatory Visit: Payer: Self-pay | Admitting: Family Medicine

## 2015-06-29 ENCOUNTER — Telehealth: Payer: Self-pay

## 2015-06-29 NOTE — Telephone Encounter (Signed)
I have not received fax from [facility] for Non-Formulary Exception Form.  I was able to print one off on line.  Form completed and faxed back to [facility] at [phone].  Awaiting decision.

## 2015-06-29 NOTE — Telephone Encounter (Signed)
Mr Klimas pts husband(DPR signed) left v/m [facility] faxed medical necessity form for pt to get namebrand synthroid on 06/21/15 and will refax another request today. Mr Buggs request cb.

## 2015-07-05 NOTE — Telephone Encounter (Addendum)
Non-Formulary Exception approved for Synthroid 112 mcg until 06/28/2016.  Left message for Mrs. Mckinnon that her medication had been approved.

## 2015-09-21 ENCOUNTER — Other Ambulatory Visit: Payer: Self-pay | Admitting: Family Medicine

## 2015-11-20 ENCOUNTER — Other Ambulatory Visit: Payer: Self-pay | Admitting: Family Medicine

## 2015-11-21 ENCOUNTER — Encounter: Payer: Self-pay | Admitting: Family Medicine

## 2015-11-21 ENCOUNTER — Ambulatory Visit (INDEPENDENT_AMBULATORY_CARE_PROVIDER_SITE_OTHER): Payer: Medicare Other | Admitting: Family Medicine

## 2015-11-21 VITALS — BP 134/80 | HR 45 | Temp 98.7°F | Ht 65.5 in | Wt 147.8 lb

## 2015-11-21 DIAGNOSIS — E039 Hypothyroidism, unspecified: Secondary | ICD-10-CM | POA: Diagnosis not present

## 2015-11-21 DIAGNOSIS — F0390 Unspecified dementia without behavioral disturbance: Secondary | ICD-10-CM | POA: Diagnosis not present

## 2015-11-21 DIAGNOSIS — F03B0 Unspecified dementia, moderate, without behavioral disturbance, psychotic disturbance, mood disturbance, and anxiety: Secondary | ICD-10-CM

## 2015-11-21 LAB — TSH: TSH: 0.87 u[IU]/mL (ref 0.35–4.50)

## 2015-11-21 LAB — T4, FREE: Free T4: 1.49 ng/dL (ref 0.60–1.60)

## 2015-11-21 LAB — T3, FREE: T3, Free: 3.4 pg/mL (ref 2.3–4.2)

## 2015-11-21 NOTE — Assessment & Plan Note (Signed)
Due for re-eval. Likely stable control on current dose of levothyroxine.

## 2015-11-21 NOTE — Patient Instructions (Signed)
Stop at lab on way out. 

## 2015-11-21 NOTE — Assessment & Plan Note (Signed)
Stable MMSE 22/30 no change in last 6 months on aricept 10 mg daily. Continue.

## 2015-11-21 NOTE — Progress Notes (Signed)
75 year old female presents for 6 month follow up dementia.  Dementia, moderate: Now on aricept 10 mg for the last 8 months Stable per pt, family is with her today, husband and daughter. No worsening of memory.  talking more about past issues. Could remember MD appointment today.. Forgot every 30 min, forgot that she showered already. No hallucinations, no confusion at night. Pinched husband once in anger, but no real behavioral issues. No SE.    Hypertension: Well controlled on tenormin and norvasc.  BP Readings from Last 3 Encounters:  11/21/15 134/80  05/23/15 131/81  03/16/15 124/90  Using medication without problems or lightheadedness: None  Chest pain with exertion: none  Edema:None  Short of breath:None  Average home BPs: well controlled daily when she checks it.  Other issues:   Elevated Cholesterol: LDL improved on no med AT LAST CHECK IN 05/2015. Now at goal LDL < 130. Refuses medication to treat.  Diet compliance: Moderate  Exercise:Walking some  Other complaints:   Recent Labs    Lab Results  Component Value Date   CHOL 224* 05/19/2015   HDL 67.00 05/19/2015   LDLCALC 129* 05/19/2015   LDLDIRECT 181.2 12/02/2012   TRIG 142.0 05/19/2015   CHOLHDL 3 05/19/2015     Wt Readings from Last 3 Encounters:  11/21/15 147 lb 12 oz (67.019 kg)  05/23/15 144 lb 12 oz (65.658 kg)  03/16/15 152 lb 12 oz (69.287 kg)  Body mass index is 24.2 kg/(m^2).  Hypothyroid: stable on Synthroid.  Due for re-eval.   Review of Systems  Constitutional: Negative for fatigue. Negative for fever.  HENT: Negative for ear pain.  Eyes: Negative for pain.  Respiratory: Negative for chest tightness and shortness of breath.  Cardiovascular: Negative for chest pain, palpitations and leg swelling.  Gastrointestinal: Negative for abdominal pain. No BM issues  Genitourinary: Negative for dysuria.  Normal mood.  Objective:   Physical Exam   Constitutional: Vital signs are normal. She appears well-developed and well-nourished. She is cooperative. Non-toxic appearance. She does not appear ill. No distress.  HENT:  Head: Normocephalic.  Right Ear: Hearing, tympanic membrane, external ear and ear canal normal.  Left Ear: Hearing, tympanic membrane, external ear and ear canal normal.  Nose: Nose normal.  Eyes: Conjunctivae, EOM and lids are normal. Pupils are equal, round, and reactive to light. No foreign bodies found.  Neck: Trachea normal and normal range of motion. Neck supple. Carotid bruit is not present. No mass and no thyromegaly present.  Cardiovascular: Normal rate, regular rhythm, S1 normal, S2 normal, normal heart sounds and intact distal pulses. Exam reveals no gallop.  No murmur heard.  Pulmonary/Chest: Effort normal and breath sounds normal. No respiratory distress. She has no wheezes. She has no rhonchi. She has no rales.  Abdominal: Soft. Normal appearance and bowel sounds are normal. She exhibits no distension, no fluid wave, no abdominal bruit and no mass. There is no hepatosplenomegaly. There is no tenderness. There is no rebound, no guarding and no CVA tenderness. No hernia.  Lymphadenopathy:  She has no cervical adenopathy.  She has no axillary adenopathy.  Neurological: She is alert. She has normal strength. No cranial nerve deficit or sensory deficit.  Skin: Skin is warm, dry and intact. No rash noted.  Psychiatric: Her speech is normal and behavior is normal. Judgment normal. Her mood appears not anxious. Cognition and memory are abnormal, decrease short term memory, confused about medications etc.. She does not exhibit a depressed mood.

## 2015-11-21 NOTE — Progress Notes (Signed)
Pre visit review using our clinic review tool, if applicable. No additional management support is needed unless otherwise documented below in the visit note. 

## 2015-12-16 ENCOUNTER — Other Ambulatory Visit: Payer: Self-pay | Admitting: Family Medicine

## 2016-02-13 ENCOUNTER — Other Ambulatory Visit: Payer: Self-pay | Admitting: Family Medicine

## 2016-03-11 ENCOUNTER — Other Ambulatory Visit: Payer: Self-pay | Admitting: Family Medicine

## 2016-03-19 ENCOUNTER — Ambulatory Visit (INDEPENDENT_AMBULATORY_CARE_PROVIDER_SITE_OTHER): Payer: Medicare Other

## 2016-03-19 DIAGNOSIS — Z23 Encounter for immunization: Secondary | ICD-10-CM

## 2016-05-08 ENCOUNTER — Other Ambulatory Visit: Payer: Self-pay | Admitting: Family Medicine

## 2016-05-10 ENCOUNTER — Emergency Department (HOSPITAL_BASED_OUTPATIENT_CLINIC_OR_DEPARTMENT_OTHER)
Admission: EM | Admit: 2016-05-10 | Discharge: 2016-05-10 | Disposition: A | Discharge status: Home / Self Care | Payer: Medicare Other | Attending: Dermatology | Admitting: Dermatology

## 2016-05-10 ENCOUNTER — Encounter (HOSPITAL_BASED_OUTPATIENT_CLINIC_OR_DEPARTMENT_OTHER): Payer: Self-pay | Admitting: Emergency Medicine

## 2016-05-10 DIAGNOSIS — Y929 Unspecified place or not applicable: Secondary | ICD-10-CM | POA: Insufficient documentation

## 2016-05-10 DIAGNOSIS — Z5321 Procedure and treatment not carried out due to patient leaving prior to being seen by health care provider: Secondary | ICD-10-CM | POA: Insufficient documentation

## 2016-05-10 DIAGNOSIS — Y9389 Activity, other specified: Secondary | ICD-10-CM | POA: Diagnosis not present

## 2016-05-10 DIAGNOSIS — W108XXA Fall (on) (from) other stairs and steps, initial encounter: Secondary | ICD-10-CM | POA: Insufficient documentation

## 2016-05-10 DIAGNOSIS — Z87891 Personal history of nicotine dependence: Secondary | ICD-10-CM | POA: Diagnosis not present

## 2016-05-10 DIAGNOSIS — Y999 Unspecified external cause status: Secondary | ICD-10-CM | POA: Diagnosis not present

## 2016-05-10 DIAGNOSIS — Z79899 Other long term (current) drug therapy: Secondary | ICD-10-CM | POA: Diagnosis not present

## 2016-05-10 DIAGNOSIS — Z7982 Long term (current) use of aspirin: Secondary | ICD-10-CM | POA: Insufficient documentation

## 2016-05-10 DIAGNOSIS — I10 Essential (primary) hypertension: Secondary | ICD-10-CM | POA: Insufficient documentation

## 2016-05-10 DIAGNOSIS — S0990XA Unspecified injury of head, initial encounter: Secondary | ICD-10-CM | POA: Insufficient documentation

## 2016-05-10 HISTORY — DX: Unspecified dementia, unspecified severity, without behavioral disturbance, psychotic disturbance, mood disturbance, and anxiety: F03.90

## 2016-05-10 NOTE — ED Triage Notes (Signed)
Patient states that she was carry bags and fell up the stairs hitting her left forehead

## 2016-05-21 ENCOUNTER — Telehealth: Payer: Self-pay | Admitting: Family Medicine

## 2016-05-21 DIAGNOSIS — M8580 Other specified disorders of bone density and structure, unspecified site: Secondary | ICD-10-CM

## 2016-05-21 DIAGNOSIS — E782 Mixed hyperlipidemia: Secondary | ICD-10-CM

## 2016-05-21 DIAGNOSIS — E039 Hypothyroidism, unspecified: Secondary | ICD-10-CM

## 2016-05-21 DIAGNOSIS — M1A9XX0 Chronic gout, unspecified, without tophus (tophi): Secondary | ICD-10-CM

## 2016-05-21 NOTE — Telephone Encounter (Signed)
-----   Message from Ellamae Sia sent at 05/14/2016  4:26 PM EST ----- Regarding: Lab orders for Wednesday, 12.13.17 Patient is scheduled for CPX labs, please order future labs, Thanks , Karna Christmas

## 2016-05-22 ENCOUNTER — Other Ambulatory Visit (INDEPENDENT_AMBULATORY_CARE_PROVIDER_SITE_OTHER): Payer: Medicare Other

## 2016-05-22 DIAGNOSIS — M1A9XX0 Chronic gout, unspecified, without tophus (tophi): Secondary | ICD-10-CM

## 2016-05-22 DIAGNOSIS — E782 Mixed hyperlipidemia: Secondary | ICD-10-CM | POA: Diagnosis not present

## 2016-05-22 DIAGNOSIS — M8580 Other specified disorders of bone density and structure, unspecified site: Secondary | ICD-10-CM | POA: Diagnosis not present

## 2016-05-22 LAB — COMPREHENSIVE METABOLIC PANEL
ALBUMIN: 4.3 g/dL (ref 3.5–5.2)
ALK PHOS: 50 U/L (ref 39–117)
ALT: 12 U/L (ref 0–35)
AST: 13 U/L (ref 0–37)
BILIRUBIN TOTAL: 1 mg/dL (ref 0.2–1.2)
BUN: 12 mg/dL (ref 6–23)
CALCIUM: 9.5 mg/dL (ref 8.4–10.5)
CO2: 31 mEq/L (ref 19–32)
Chloride: 104 mEq/L (ref 96–112)
Creatinine, Ser: 0.75 mg/dL (ref 0.40–1.20)
GFR: 79.7 mL/min (ref >60.00)
GLUCOSE: 96 mg/dL (ref 70–99)
Potassium: 3.5 mEq/L (ref 3.5–5.1)
Sodium: 142 mEq/L (ref 135–145)
TOTAL PROTEIN: 7.2 g/dL (ref 6.0–8.3)

## 2016-05-22 LAB — VITAMIN D 25 HYDROXY (VIT D DEFICIENCY, FRACTURES): VITD: 25.6 ng/mL — ABNORMAL LOW (ref 30.00–100.00)

## 2016-05-22 LAB — LIPID PANEL
Cholesterol: 241 mg/dL — ABNORMAL HIGH (ref 0–200)
HDL: 73.5 mg/dL (ref >39.00)
LDL Cholesterol: 145 mg/dL — ABNORMAL HIGH (ref 0–99)
NONHDL: 167.94
Total CHOL/HDL Ratio: 3
Triglycerides: 113 mg/dL (ref 0.0–149.0)
VLDL: 22.6 mg/dL (ref 0.0–40.0)

## 2016-05-22 LAB — URIC ACID: Uric Acid, Serum: 6.2 mg/dL (ref 2.4–7.0)

## 2016-05-24 ENCOUNTER — Ambulatory Visit (INDEPENDENT_AMBULATORY_CARE_PROVIDER_SITE_OTHER): Payer: Medicare Other

## 2016-05-24 VITALS — BP 108/80 | HR 50 | Temp 98.9°F | Ht 65.0 in | Wt 141.5 lb

## 2016-05-24 DIAGNOSIS — Z0000 Encounter for general adult medical examination without abnormal findings: Secondary | ICD-10-CM | POA: Diagnosis not present

## 2016-05-24 NOTE — Progress Notes (Signed)
PCP notes:   Health maintenance:  PPSV23 - pt will discuss with PCP at next appt Shingles - pt will contact insurance regarding coverage  Abnormal screenings:   Hearing - failed  Patient concerns:   None  Nurse concerns:  None  Next PCP appt:   06/07/16 @ 1145

## 2016-05-24 NOTE — Progress Notes (Signed)
Subjective:   Gloria Lloyd is a 75 y.o. female who presents for Medicare Annual (Subsequent) preventive examination.  Review of Systems:  N/A Cardiac Risk Factors include: advanced age (>73men, >5 women);sedentary lifestyle;dyslipidemia;hypertension     Objective:     Vitals: BP 108/80 (BP Location: Left Arm, Patient Position: Sitting, Cuff Size: Normal)   Pulse (!) 50   Temp 98.9 F (37.2 C) (Oral)   Ht 5\' 5"  (1.651 m) Comment: no shoes  Wt 141 lb 8 oz (64.2 kg)   SpO2 98%   BMI 23.55 kg/m   Body mass index is 23.55 kg/m.   Tobacco History  Smoking Status  . Former Smoker  Smokeless Tobacco  . Never Used     Counseling given: No   Past Medical History:  Diagnosis Date  . Adenomatous polyp of colon 04/2005  . Dementia   . Diverticulosis   . Erosive esophagitis   . Gout   . Hiatal hernia   . Hyperlipidemia   . Hypertension   . Thyroid disease    Past Surgical History:  Procedure Laterality Date  . THYROIDECTOMY, PARTIAL    . TONSILLECTOMY     Family History  Problem Relation Age of Onset  . Diabetes Mother   . Hyperlipidemia Mother   . Hypertension Mother   . Hyperthyroidism Mother   . Coronary artery disease Maternal Grandmother    History  Sexual Activity  . Sexual activity: Not Currently    Outpatient Encounter Prescriptions as of 05/24/2016  Medication Sig  . amLODipine (NORVASC) 5 MG tablet TAKE 1 TABLET BY MOUTH EVERY DAY  . aspirin 81 MG tablet Take 81 mg by mouth daily as needed.   Marland Kitchen atenolol (TENORMIN) 25 MG tablet TAKE 1 TABLET BY MOUTH EVERY DAY  . Omega-3 Fatty Acids (FISH OIL) 1000 MG CAPS Take 1 capsule by mouth daily as needed. Reported on 11/21/2015  . SYNTHROID 112 MCG tablet TAKE 1 TABLET BY MOUTH EVERY DAY BEFORE BREAKFAST  . vitamin B-12 (CYANOCOBALAMIN) 1000 MCG tablet Take 1 tablet (1,000 mcg total) by mouth daily. (Patient taking differently: Take 1,000 mcg by mouth as needed. )  . Cholecalciferol (VITAMIN D) 1000  UNITS capsule Take 1,000 Units by mouth 2 (two) times daily. Reported on 11/21/2015  . donepezil (ARICEPT) 10 MG tablet TAKE 1 TABLET BY MOUTH AT BEDTIME (Patient not taking: Reported on 05/24/2016)   No facility-administered encounter medications on file as of 05/24/2016.     Activities of Daily Living In your present state of health, do you have any difficulty performing the following activities: 05/24/2016 05/24/2016  Hearing? - N  Vision? - N  Difficulty concentrating or making decisions? - N  Walking or climbing stairs? - N  Dressing or bathing? - N  Doing errands, shopping? - N  Conservation officer, nature and eating ? - N  Using the Toilet? - N  In the past six months, have you accidently leaked urine? - N  Do you have problems with loss of bowel control? - N  Managing your Medications? - N  Managing your Finances? Tempie Donning  Housekeeping or managing your Housekeeping? - N  Some recent data might be hidden    Patient Care Team: Jinny Sanders, MD as PCP - General    Assessment:     Hearing Screening   125Hz  250Hz  500Hz  1000Hz  2000Hz  3000Hz  4000Hz  6000Hz  8000Hz   Right ear:   0 0 0  0    Left ear:   0  0 40  0      Visual Acuity Screening   Right eye Left eye Both eyes  Without correction:     With correction: 20/40-1 20/70 20/30-1    Exercise Activities and Dietary recommendations Current Exercise Habits: The patient does not participate in regular exercise at present, Exercise limited by: None identified  Goals    . proper sleep          Starting 05/24/2016, I will continue to sleep approx. 8 hours per night.       Fall Risk Fall Risk  05/24/2016 05/23/2015 01/20/2014 12/09/2012  Falls in the past year? No No No No   Depression Screen PHQ 2/9 Scores 05/24/2016 05/23/2015 01/20/2014 12/09/2012  PHQ - 2 Score 0 0 0 0     Cognitive Function MMSE - Mini Mental State Exam 05/24/2016  Not completed: (No Data)    NOTE: pt has dx of unspecified dementia. A Mini-Cog screen was not  completed.     Immunization History  Administered Date(s) Administered  . Influenza Split 04/01/2012  . Influenza Whole 04/06/2007, 03/09/2008, 03/16/2009  . Influenza,inj,Quad PF,36+ Mos 02/19/2013, 03/29/2014, 03/16/2015, 03/19/2016  . Pneumococcal Conjugate-13 05/23/2015  . Pneumococcal Polysaccharide-23 06/11/2003  . Td 11/09/2006   Screening Tests Health Maintenance  Topic Date Due  . PNA vac Low Risk Adult (2 of 2 - PPSV23) 06/07/2016 (Originally 05/22/2016)  . ZOSTAVAX  05/24/2017 (Originally 09/02/1999)  . TETANUS/TDAP  11/08/2016  . COLONOSCOPY  04/08/2018  . INFLUENZA VACCINE  Completed  . DEXA SCAN  Completed      Plan:     I have personally reviewed and addressed the Medicare Annual Wellness questionnaire and have noted the following in the patient's chart:  A. Medical and social history B. Use of alcohol, tobacco or illicit drugs  C. Current medications and supplements D. Functional ability and status E.  Nutritional status F.  Physical activity G. Advance directives H. List of other physicians I.  Hospitalizations, surgeries, and ER visits in previous 12 months J.  [place] to include hearing, vision, cognitive, depression L. Referrals and appointments - none  In addition, I have reviewed and discussed with patient certain preventive protocols, quality metrics, and best practice recommendations. A written personalized care plan for preventive services as well as general preventive health recommendations were provided to patient.  See attached scanned questionnaire for additional information.   Signed,   Lindell Noe, MHA, BS, LPN Health Coach

## 2016-05-24 NOTE — Patient Instructions (Signed)
Gloria Lloyd , Thank you for taking time to come for your Medicare Wellness Visit. I appreciate your ongoing commitment to your health goals. Please review the following plan we discussed and let me know if I can assist you in the future.   These are the goals we discussed: Goals    . proper sleep          Starting 05/24/2016, I will continue to sleep approx. 8 hours per night.        This is a list of the screening recommended for you and due dates:  Health Maintenance  Topic Date Due  . Pneumonia vaccines (2 of 2 - PPSV23) 06/07/2016*  . Shingles Vaccine  05/24/2017*  . Tetanus Vaccine  11/08/2016  . Colon Cancer Screening  04/08/2018  . Flu Shot  Completed  . DEXA scan (bone density measurement)  Completed  *Topic was postponed. The date shown is not the original due date.   Preventive Care for Adults  A healthy lifestyle and preventive care can promote health and wellness. Preventive health guidelines for adults include the following key practices.  . A routine yearly physical is a good way to check with your health care provider about your health and preventive screening. It is a chance to share any concerns and updates on your health and to receive a thorough exam.  . Visit your dentist for a routine exam and preventive care every 6 months. Brush your teeth twice a day and floss once a day. Good oral hygiene prevents tooth decay and gum disease.  . The frequency of eye exams is based on your age, health, family medical history, use  of contact lenses, and other factors. Follow your health care provider's ecommendations for frequency of eye exams.  . Eat a healthy diet. Foods like vegetables, fruits, whole grains, low-fat dairy products, and lean protein foods contain the nutrients you need without too many calories. Decrease your intake of foods high in solid fats, added sugars, and salt. Eat the right amount of calories for you. Get information about a proper diet from your  health care provider, if necessary.  . Regular physical exercise is one of the most important things you can do for your health. Most adults should get at least 150 minutes of moderate-intensity exercise (any activity that increases your heart rate and causes you to sweat) each week. In addition, most adults need muscle-strengthening exercises on 2 or more days a week.  Silver Sneakers may be a benefit available to you. To determine eligibility, you may visit the website: www.silversneakers.com or contact program at [phone] Mon-Fri between 8AM-8PM.   . Maintain a healthy weight. The body mass index (BMI) is a screening tool to identify possible weight problems. It provides an estimate of body fat based on height and weight. Your health care provider can find your BMI and can help you achieve or maintain a healthy weight.   For adults 20 years and older: ? A BMI below 18.5 is considered underweight. ? A BMI of 18.5 to 24.9 is normal. ? A BMI of 25 to 29.9 is considered overweight. ? A BMI of 30 and above is considered obese.   . Maintain normal blood lipids and cholesterol levels by exercising and minimizing your intake of saturated fat. Eat a balanced diet with plenty of fruit and vegetables. Blood tests for lipids and cholesterol should begin at age 32 and be repeated every 5 years. If your lipid or cholesterol levels are high,  you are over 50, or you are at high risk for heart disease, you may need your cholesterol levels checked more frequently. Ongoing high lipid and cholesterol levels should be treated with medicines if diet and exercise are not working.  . If you smoke, find out from your health care provider how to quit. If you do not use tobacco, please do not start.  . If you choose to drink alcohol, please do not consume more than 2 drinks per day. One drink is considered to be 12 ounces (355 mL) of beer, 5 ounces (148 mL) of wine, or 1.5 ounces (44 mL) of liquor.  . If you are  53-9 years old, ask your health care provider if you should take aspirin to prevent strokes.  . Use sunscreen. Apply sunscreen liberally and repeatedly throughout the day. You should seek shade when your shadow is shorter than you. Protect yourself by wearing long sleeves, pants, a wide-brimmed hat, and sunglasses year round, whenever you are outdoors.  . Once a month, do a whole body skin exam, using a mirror to look at the skin on your back. Tell your health care provider of new moles, moles that have irregular borders, moles that are larger than a pencil eraser, or moles that have changed in shape or color.

## 2016-05-24 NOTE — Progress Notes (Signed)
Pre visit review using our clinic review tool, if applicable. No additional management support is needed unless otherwise documented below in the visit note. 

## 2016-05-28 NOTE — Progress Notes (Signed)
I reviewed health advisor's note, was available for consultation, and agree with documentation and plan.  Govanni Plemons, MD [facility] [place]  

## 2016-06-07 ENCOUNTER — Encounter: Payer: Self-pay | Admitting: Family Medicine

## 2016-06-07 ENCOUNTER — Ambulatory Visit (INDEPENDENT_AMBULATORY_CARE_PROVIDER_SITE_OTHER): Payer: Medicare Other | Admitting: Family Medicine

## 2016-06-07 VITALS — BP 120/70 | HR 46 | Temp 98.1°F | Ht 65.25 in | Wt 143.0 lb

## 2016-06-07 DIAGNOSIS — M1A9XX0 Chronic gout, unspecified, without tophus (tophi): Secondary | ICD-10-CM

## 2016-06-07 DIAGNOSIS — Z23 Encounter for immunization: Secondary | ICD-10-CM | POA: Diagnosis not present

## 2016-06-07 DIAGNOSIS — E039 Hypothyroidism, unspecified: Secondary | ICD-10-CM | POA: Diagnosis not present

## 2016-06-07 DIAGNOSIS — I10 Essential (primary) hypertension: Secondary | ICD-10-CM

## 2016-06-07 DIAGNOSIS — F0390 Unspecified dementia without behavioral disturbance: Secondary | ICD-10-CM

## 2016-06-07 DIAGNOSIS — F03B0 Unspecified dementia, moderate, without behavioral disturbance, psychotic disturbance, mood disturbance, and anxiety: Secondary | ICD-10-CM

## 2016-06-07 DIAGNOSIS — E782 Mixed hyperlipidemia: Secondary | ICD-10-CM | POA: Diagnosis not present

## 2016-06-07 NOTE — Addendum Note (Signed)
Addended by: Modena Nunnery on: 06/07/2016 01:01 PM   Modules accepted: Orders

## 2016-06-07 NOTE — Assessment & Plan Note (Signed)
Well controlled. Continue current medication.  

## 2016-06-07 NOTE — Progress Notes (Signed)
Subjective:    Patient ID: Gloria Lloyd, female    DOB: Oct 19, 1940, 75 y.o.   MRN: [id number]  HPI   75 year old female presents for PART 2 of AMW. The patient saw Candis Musa, LPN for medicare wellness. Note reviewed in detail and important notes copied below. PPSV23 - pt will discuss with PCP at next appt Shingles - pt will contact insurance regarding coverage  Abnormal screenings:  Hearing - failed  Patient concerns:  None  Nurse concerns: None   Dementia moderate, stable on aricept 10 mg. No SE.  Hypertension:   Well controlled on tenormin and norvasc BP Readings from Last 3 Encounters:  06/07/16 120/70  05/24/16 108/80  05/10/16 147/82  Using medication without problems or lightheadedness:  None Chest pain with exertion:None Edema:None Short of breath:none Average home BPs: not checking. Other issues:   Elevated Cholesterol:  LDL elevated but pt not interested in treatment. Lab Results  Component Value Date   LDLCALC 145 (H) 05/22/2016  Using medications without problems:None Muscle aches: None Diet compliance:low fat diet, Exercise:minimal Other complaints:  Hypothyroid, stable on synthroid  At last check. Lab Results  Component Value Date   TSH 0.87 11/21/2015   Gout ,Good control off meds without flares. Lab Results  Component Value Date   LABURIC 6.2 05/22/2016   Prediabetes:  Social History /Family History/Past Medical History reviewed and updated if needed. Patient Care Team: Jinny Sanders, MD as PCP - General  Review of Systems  Constitutional: Negative for fatigue and fever.  HENT: Negative for congestion.   Eyes: Negative for pain.  Respiratory: Negative for cough and shortness of breath.   Cardiovascular: Negative for chest pain, palpitations and leg swelling.  Gastrointestinal: Negative for abdominal pain.  Genitourinary: Negative for dysuria and vaginal bleeding.  Musculoskeletal: Positive for back pain.    Neurological: Negative for syncope, light-headedness and headaches.  Psychiatric/Behavioral: Negative for dysphoric mood.       Objective:   Physical Exam  Constitutional: Vital signs are normal. She appears well-developed and well-nourished. She is cooperative.  Non-toxic appearance. She does not appear ill. No distress.  HENT:  Head: Normocephalic.  Right Ear: Hearing, tympanic membrane, external ear and ear canal normal.  Left Ear: Hearing, tympanic membrane, external ear and ear canal normal.  Nose: Nose normal.  Eyes: Conjunctivae, EOM and lids are normal. Pupils are equal, round, and reactive to light. Lids are everted and swept, no foreign bodies found.  Neck: Trachea normal and normal range of motion. Neck supple. Carotid bruit is not present. No thyroid mass and no thyromegaly present.  Cardiovascular: Normal rate, regular rhythm, S1 normal, S2 normal, normal heart sounds and intact distal pulses.  Exam reveals no gallop.   No murmur heard. Pulmonary/Chest: Effort normal and breath sounds normal. No respiratory distress. She has no wheezes. She has no rhonchi. She has no rales.  Abdominal: Soft. Normal appearance and bowel sounds are normal. She exhibits no distension, no fluid wave, no abdominal bruit and no mass. There is no hepatosplenomegaly. There is no tenderness. There is no rebound, no guarding and no CVA tenderness. No hernia.  Lymphadenopathy:    She has no cervical adenopathy.    She has no axillary adenopathy.  Neurological: She is alert. She has normal strength. No cranial nerve deficit or sensory deficit.   Oriented to self and place not time.  Skin: Skin is warm, dry and intact. No rash noted.  Psychiatric: Her speech is normal and  behavior is normal. Judgment and thought content normal. Her mood appears not anxious. Cognition and memory are impaired. She does not exhibit a depressed mood. She exhibits abnormal recent memory.          Assessment & Plan:  The  patient's preventative maintenance and recommended screening tests for an annual wellness exam were reviewed in full today. Brought up to date unless services declined.  Counselled on the importance of diet, exercise, and its role in overall health and mortality. The patient's FH and SH was reviewed, including their home life, tobacco status, and drug and alcohol status.   Vaccines: Due for PNA 23 and  Refused shingles Pap/DVE: Not indicated, No family history of uterine or ovarian cancer, asymptomatic.  Not indicate DVE. Mammo:  Not indicated Bone Density:04/2011 osteopenia in hip, nml in spine, plan repeat in 5 years.  Colon: 2014 , polyps plan repeat in 5 years. Dr.Stark.   Smoking Status:None ETOH/ drug [id number]

## 2016-06-07 NOTE — Assessment & Plan Note (Signed)
Stable per husband on aricept without SE. Re-eval MMSE in 6 months.

## 2016-06-07 NOTE — Assessment & Plan Note (Signed)
Pt and husband refuse medication to treat. Will work on low cholesterol diet.

## 2016-06-07 NOTE — Patient Instructions (Addendum)
Recommend  vit D supplement 400 IU 1-2 times daily.  Work on low cholesterol diet and regular exercise.

## 2016-06-07 NOTE — Assessment & Plan Note (Signed)
No flares on no medication.

## 2016-06-22 ENCOUNTER — Other Ambulatory Visit: Payer: Self-pay | Admitting: Family Medicine

## 2016-07-03 ENCOUNTER — Telehealth: Payer: Self-pay

## 2016-07-03 ENCOUNTER — Telehealth: Payer: Self-pay | Admitting: *Deleted

## 2016-07-03 NOTE — Telephone Encounter (Signed)
Medicare called to let us know pt's Synthroid 112mg  has been approved from 07/03/16 through 07/03/17.

## 2016-07-03 NOTE — Telephone Encounter (Signed)
PA approved from 07/03/2016 thru 07/03/2017.  PA# [id number].

## 2016-07-03 NOTE — Telephone Encounter (Signed)
Received a Non-Formulary Exception Form  from [facility] for Brand Name Synthroid 112 mcg. Forms completed and faxed back to [phone].

## 2016-08-06 ENCOUNTER — Other Ambulatory Visit: Payer: Self-pay | Admitting: Family Medicine

## 2016-08-08 ENCOUNTER — Encounter (INDEPENDENT_AMBULATORY_CARE_PROVIDER_SITE_OTHER): Payer: Self-pay

## 2016-08-08 ENCOUNTER — Ambulatory Visit (INDEPENDENT_AMBULATORY_CARE_PROVIDER_SITE_OTHER)
Admission: RE | Admit: 2016-08-08 | Discharge: 2016-08-08 | Disposition: A | Discharge status: Home / Self Care | Payer: Medicare Other | Source: Ambulatory Visit | Attending: Internal Medicine | Admitting: Internal Medicine

## 2016-08-08 ENCOUNTER — Encounter: Payer: Self-pay | Admitting: Internal Medicine

## 2016-08-08 ENCOUNTER — Ambulatory Visit (INDEPENDENT_AMBULATORY_CARE_PROVIDER_SITE_OTHER): Payer: Medicare Other | Admitting: Internal Medicine

## 2016-08-08 VITALS — BP 120/66 | HR 54 | Temp 98.3°F | Wt 141.8 lb

## 2016-08-08 DIAGNOSIS — M545 Low back pain, unspecified: Secondary | ICD-10-CM

## 2016-08-08 DIAGNOSIS — Y92009 Unspecified place in unspecified non-institutional (private) residence as the place of occurrence of the external cause: Secondary | ICD-10-CM

## 2016-08-08 DIAGNOSIS — Y92099 Unspecified place in other non-institutional residence as the place of occurrence of the external cause: Secondary | ICD-10-CM | POA: Diagnosis not present

## 2016-08-08 DIAGNOSIS — W19XXXA Unspecified fall, initial encounter: Secondary | ICD-10-CM

## 2016-08-08 DIAGNOSIS — M8580 Other specified disorders of bone density and structure, unspecified site: Secondary | ICD-10-CM

## 2016-08-08 NOTE — Patient Instructions (Signed)
Back Pain, Adult  Back pain is very common. The pain often gets better over time. The cause of back pain is usually not dangerous. Most people can learn to manage their back pain on their own.  Follow these instructions at home:  Watch your back pain for any changes. The following actions may help to lessen any pain you are feeling:  · Stay active. Start with short walks on flat ground if you can. Try to walk farther each day.  · Exercise regularly as told by your doctor. Exercise helps your back heal faster. It also helps avoid future injury by keeping your muscles strong and flexible.  · Do not sit, drive, or stand in one place for more than 30 minutes.  · Do not stay in bed. Resting more than 1-2 days can slow down your recovery.  · Be careful when you bend or lift an object. Use good form when lifting:  ? Bend at your knees.  ? Keep the object close to your body.  ? Do not twist.  · Sleep on a firm mattress. Lie on your side, and bend your knees. If you lie on your back, put a pillow under your knees.  · Take medicines only as told by your doctor.  · Put ice on the injured area.  ? Put ice in a plastic bag.  ? Place a towel between your skin and the bag.  ? Leave the ice on for 20 minutes, 2-3 times a day for the first 2-3 days. After that, you can switch between ice and heat packs.  · Avoid feeling anxious or stressed. Find good ways to deal with stress, such as exercise.  · Maintain a healthy weight. Extra weight puts stress on your back.    Contact a doctor if:  · You have pain that does not go away with rest or medicine.  · You have worsening pain that goes down into your legs or buttocks.  · You have pain that does not get better in one week.  · You have pain at night.  · You lose weight.  · You have a fever or chills.  Get help right away if:  · You cannot control when you poop (bowel movement) or pee (urinate).  · Your arms or legs feel weak.  · Your arms or legs lose feeling (numbness).  · You feel sick  to your stomach (nauseous) or throw up (vomit).  · You have belly (abdominal) pain.  · You feel like you may pass out (faint).  This information is not intended to replace advice given to you by your health care provider. Make sure you discuss any questions you have with your health care provider.  Document Released: 11/13/2007 Document Revised: 11/02/2015 Document Reviewed: 09/28/2013  Elsevier Interactive Patient Education © 2017 [employer] Inc[employer]

## 2016-08-08 NOTE — Progress Notes (Signed)
Subjective:    Patient ID: Gloria Lloyd, female    DOB: [birth year], 76 y.o.   MRN: [id number]  HPI  Pt presents to the clinic today with c/o back pain s/p fall. This occurred 4 days ago. She landed on her buttocks and lower back. She describes the pain as sore and achy. It seems worse going from a standing to a sitting position. She denies numbness, tingling or loss of bowel or bladder. She has taken Tylenol, Ibuprofen and a heating pad with some relief. She does have a history of osteopenia.  Review of Systems      Past Medical History:  Diagnosis Date  . Adenomatous polyp of colon 04/2005  . Dementia   . Diverticulosis   . Erosive esophagitis   . Gout   . Hiatal hernia   . Hyperlipidemia   . Hypertension   . Thyroid disease     Current Outpatient Prescriptions  Medication Sig Dispense Refill  . amLODipine (NORVASC) 5 MG tablet TAKE 1 TABLET BY MOUTH EVERY DAY 90 tablet 1  . aspirin 81 MG tablet Take 81 mg by mouth daily as needed.     Marland Kitchen atenolol (TENORMIN) 25 MG tablet TAKE 1 TABLET BY MOUTH DAILY 90 tablet 1  . Cholecalciferol (VITAMIN D) 1000 UNITS capsule Take 1,000 Units by mouth 2 (two) times daily. Reported on 11/21/2015    . donepezil (ARICEPT) 10 MG tablet TAKE 1 TABLET BY MOUTH AT BEDTIME 90 tablet 3  . Omega-3 Fatty Acids (FISH OIL) 1000 MG CAPS Take 1 capsule by mouth daily as needed. Reported on 11/21/2015    . SYNTHROID 112 MCG tablet TAKE 1 TABLET BY MOUTH EVERY DAY BEFORE BREAKFAST 90 tablet 3  . vitamin B-12 (CYANOCOBALAMIN) 1000 MCG tablet Take 1 tablet (1,000 mcg total) by mouth daily. (Patient taking differently: Take 1,000 mcg by mouth as needed. ) 30 tablet 11   No current facility-administered medications for this visit.     Allergies  Allergen Reactions  . Penicillins     REACTION: As a child  . Sulfonamide Derivatives     REACTION: Rash, itch.    Family History  Problem Relation Age of Onset  . Diabetes Mother   . Hyperlipidemia Mother     . Hypertension Mother   . Hyperthyroidism Mother   . Coronary artery disease Maternal Grandmother     Social History   Social History  . Marital status: Married    Spouse name: N/A  . Number of children: 2  . Years of education: N/A   Occupational History  . retired Retired   Social History Main Topics  . Smoking status: Former Research scientist (life sciences)  . Smokeless tobacco: Never Used  . Alcohol use 0.0 oz/week     Comment: occasional  . Drug use: No  . Sexual activity: Not Currently   Other Topics Concern  . Not on file   Social History Narrative   Regular exercise--no, walking some.   Diet: fruit and veggies, water   No living will, HCPOA is husband, full code (reviewed 2014)                    Constitutional: Denies fever, malaise, fatigue, headache or abrupt weight changes.  Musculoskeletal: Pt reports back pain. Denies difficulty with gait, muscle pain or joint swelling.    No other specific complaints in a complete review of systems (except as listed in HPI above).  Objective:   Physical Exam   BP 120/66  Pulse (!) 54   Temp 98.3 F (36.8 C) (Oral)   Wt 141 lb 12 oz (64.3 kg)   SpO2 98%   BMI 23.41 kg/m  Wt Readings from Last 3 Encounters:  08/08/16 141 lb 12 oz (64.3 kg)  06/07/16 143 lb (64.9 kg)  05/24/16 141 lb 8 oz (64.2 kg)    General: Appears her stated age, well developed, well nourished in NAD. Skin: No abrasions or bruising noted. Musculoskeletal: Normal flexion, extension and rotation of the lumbar spine. Mild bony tenderness noted over the spine. Strength 5/5 BLE.  No difficulty with gait.    BMET    Component Value Date/Time   NA 142 05/22/2016 0930   K 3.5 05/22/2016 0930   CL 104 05/22/2016 0930   CO2 31 05/22/2016 0930   GLUCOSE 96 05/22/2016 0930   BUN 12 05/22/2016 0930   CREATININE 0.75 05/22/2016 0930   CALCIUM 9.5 05/22/2016 0930   CALCIUM 10.3 04/08/2007 0008   GFRNONAA 72.10 04/30/2010 1313   GFRAA 92 08/15/2008 1329     Lipid Panel     Component Value Date/Time   CHOL 241 (H) 05/22/2016 0930   TRIG 113.0 05/22/2016 0930   HDL 73.50 05/22/2016 0930   CHOLHDL 3 05/22/2016 0930   VLDL 22.6 05/22/2016 0930   LDLCALC 145 (H) 05/22/2016 0930    CBC    Component Value Date/Time   WBC 9.6 05/19/2015 0910   RBC 5.55 (H) 05/19/2015 0910   HGB 16.5 (H) 05/19/2015 0910   HCT 50.0 (H) 05/19/2015 0910   PLT 287.0 05/19/2015 0910   MCV 90.0 05/19/2015 0910   MCHC 33.1 05/19/2015 0910   RDW 13.3 05/19/2015 0910   LYMPHSABS 2.1 05/19/2015 0910   MONOABS 0.7 05/19/2015 0910   EOSABS 0.2 05/19/2015 0910   BASOSABS 0.1 05/19/2015 0910    Hgb A1C No results found for: HGBA1C         Assessment & Plan:   Low back pain s/p fall at home:  Continue Tylenol and Ibuprofen Heating pad may be helpful Xray of lumbar spine today  RTC as needed or if symptoms persist or worsen Allaina Brotzman, NP

## 2016-12-06 ENCOUNTER — Encounter: Payer: Self-pay | Admitting: Family Medicine

## 2016-12-06 ENCOUNTER — Ambulatory Visit (INDEPENDENT_AMBULATORY_CARE_PROVIDER_SITE_OTHER): Payer: Medicare Other | Admitting: Family Medicine

## 2016-12-06 DIAGNOSIS — F0390 Unspecified dementia without behavioral disturbance: Secondary | ICD-10-CM | POA: Diagnosis not present

## 2016-12-06 DIAGNOSIS — F03B0 Unspecified dementia, moderate, without behavioral disturbance, psychotic disturbance, mood disturbance, and anxiety: Secondary | ICD-10-CM

## 2016-12-06 DIAGNOSIS — I10 Essential (primary) hypertension: Secondary | ICD-10-CM | POA: Diagnosis not present

## 2016-12-06 MED ORDER — MEMANTINE HCL 5 MG PO TABS: 5.0000 mg | ORAL_TABLET | Freq: Two times a day (BID) | ORAL | 11 refills | Status: DC

## 2016-12-06 NOTE — Assessment & Plan Note (Addendum)
MMSE 17/30  Progressing.  Continue aricept but add namenda to regimen.

## 2016-12-06 NOTE — Assessment & Plan Note (Signed)
Well controlled. Continue current medication.  

## 2016-12-06 NOTE — Progress Notes (Signed)
   Subjective:    Patient ID: Gloria Lloyd, female    DOB: 1941/03/09, 75 y.o.   MRN: [id number]  HPI    76 year old female with dementia moderate presents with her husband for 6 month follow up.   Back is resolved.   Dementia, moderate: per family  Still gradually worsening on aricept 10 mg no SE. No wandering, no paranoia, no behavioral issues. No hallucinations.     Hypertension:   Well controlled  On atenolol and amlodipine  Using medication without problems or lightheadedness:  none Chest pain with exertion: none Edema:none Short of breath: none Average home BPs: stable control Other issues: walks a lot on steps   Wt Readings from Last 3 Encounters:  12/06/16 144 lb 4 oz (65.4 kg)  08/08/16 141 lb 12 oz (64.3 kg)  06/07/16 143 lb (64.9 kg)    AMW due in 05/2017  Blood pressure 112/62, pulse (!) 53, temperature 98.5 F (36.9 C), temperature source Oral, height 5' 5.25" (1.657 m), weight 144 lb 4 oz (65.4 kg), SpO2 92 %.   Review of Systems  Constitutional: Negative for fatigue and fever.  HENT: Negative for ear pain.   Eyes: Negative for pain.  Respiratory: Negative for chest tightness and shortness of breath.   Cardiovascular: Negative for chest pain, palpitations and leg swelling.  Gastrointestinal: Negative for abdominal pain.  Genitourinary: Negative for dysuria.       Objective:   Physical Exam  Constitutional: Vital signs are normal. She appears well-developed and well-nourished. She is cooperative.  Non-toxic appearance. She does not appear ill. No distress.  HENT:  Head: Normocephalic.  Right Ear: Hearing, tympanic membrane, external ear and ear canal normal. Tympanic membrane is not erythematous, not retracted and not bulging.  Left Ear: Hearing, tympanic membrane, external ear and ear canal normal. Tympanic membrane is not erythematous, not retracted and not bulging.  Nose: No mucosal edema or rhinorrhea. Right sinus exhibits no maxillary sinus  tenderness and no frontal sinus tenderness. Left sinus exhibits no maxillary sinus tenderness and no frontal sinus tenderness.  Mouth/Throat: Uvula is midline, oropharynx is clear and moist and mucous membranes are normal.  Eyes: Conjunctivae, EOM and lids are normal. Pupils are equal, round, and reactive to light. Lids are everted and swept, no foreign bodies found.  Neck: Trachea normal and normal range of motion. Neck supple. Carotid bruit is not present. No thyroid mass and no thyromegaly present.  Cardiovascular: Normal rate, regular rhythm, S1 normal, S2 normal, normal heart sounds, intact distal pulses and normal pulses.  Exam reveals no gallop and no friction rub.   No murmur heard. Pulmonary/Chest: Effort normal and breath sounds normal. No tachypnea. No respiratory distress. She has no decreased breath sounds. She has no wheezes. She has no rhonchi. She has no rales.  Abdominal: Soft. Normal appearance and bowel sounds are normal. There is no tenderness.  Neurological: She is alert. No cranial nerve deficit or sensory deficit. Gait normal.  Skin: Skin is warm, dry and intact. No rash noted.  Psychiatric: Her speech is normal and behavior is normal. Thought content normal. Her mood appears not anxious. Cognition and memory are impaired. She expresses impulsivity and inappropriate judgment. She does not exhibit a depressed mood. She exhibits abnormal recent memory and abnormal remote memory.    MMSE 17/30 today       Assessment & Plan:

## 2016-12-06 NOTE — Patient Instructions (Signed)
Continue Aricept.  Add Namenda twice daily.

## 2016-12-07 ENCOUNTER — Other Ambulatory Visit: Payer: Self-pay | Admitting: Family Medicine

## 2016-12-09 ENCOUNTER — Other Ambulatory Visit: Payer: Self-pay | Admitting: Family Medicine

## 2017-02-18 ENCOUNTER — Other Ambulatory Visit: Payer: Self-pay | Admitting: Family Medicine

## 2017-03-17 ENCOUNTER — Other Ambulatory Visit: Payer: Self-pay | Admitting: Family Medicine

## 2017-03-17 NOTE — Telephone Encounter (Signed)
Pt stable thyroid function and with dementia.Faythe Ghee to refill.. Will recheck TSH when she comes in in 06/2107

## 2017-03-17 NOTE — Telephone Encounter (Signed)
Last office visit 12/06/2016.  Last TSH 11/21/2015.  Refill?

## 2017-03-26 ENCOUNTER — Ambulatory Visit (INDEPENDENT_AMBULATORY_CARE_PROVIDER_SITE_OTHER): Payer: Medicare Other

## 2017-03-26 DIAGNOSIS — Z23 Encounter for immunization: Secondary | ICD-10-CM

## 2017-06-06 ENCOUNTER — Other Ambulatory Visit: Payer: Self-pay | Admitting: Family Medicine

## 2017-06-12 ENCOUNTER — Ambulatory Visit (INDEPENDENT_AMBULATORY_CARE_PROVIDER_SITE_OTHER): Payer: Medicare Other

## 2017-06-12 ENCOUNTER — Telehealth (INDEPENDENT_AMBULATORY_CARE_PROVIDER_SITE_OTHER): Payer: Medicare Other | Admitting: Family Medicine

## 2017-06-12 VITALS — BP 114/74 | HR 59 | Temp 98.5°F | Ht 65.0 in | Wt 148.5 lb

## 2017-06-12 DIAGNOSIS — M8580 Other specified disorders of bone density and structure, unspecified site: Secondary | ICD-10-CM

## 2017-06-12 DIAGNOSIS — M1A9XX0 Chronic gout, unspecified, without tophus (tophi): Secondary | ICD-10-CM

## 2017-06-12 DIAGNOSIS — E039 Hypothyroidism, unspecified: Secondary | ICD-10-CM | POA: Diagnosis not present

## 2017-06-12 DIAGNOSIS — I10 Essential (primary) hypertension: Secondary | ICD-10-CM

## 2017-06-12 DIAGNOSIS — Z0000 Encounter for general adult medical examination without abnormal findings: Secondary | ICD-10-CM

## 2017-06-12 DIAGNOSIS — D751 Secondary polycythemia: Secondary | ICD-10-CM | POA: Diagnosis not present

## 2017-06-12 LAB — CBC WITH DIFFERENTIAL/PLATELET
BASOS PCT: 1.2 % (ref 0.0–3.0)
Basophils Absolute: 0.1 10*3/uL (ref 0.0–0.1)
EOS PCT: 2.9 % (ref 0.0–5.0)
Eosinophils Absolute: 0.2 10*3/uL (ref 0.0–0.7)
HCT: 49.4 % — ABNORMAL HIGH (ref 36.0–46.0)
HEMOGLOBIN: 16.3 g/dL — AB (ref 12.0–15.0)
Lymphocytes Relative: 27.1 % (ref 12.0–46.0)
Lymphs Abs: 2.2 10*3/uL (ref 0.7–4.0)
MCHC: 33.1 g/dL (ref 30.0–36.0)
MCV: 91.5 fl (ref 78.0–100.0)
MONO ABS: 0.7 10*3/uL (ref 0.1–1.0)
MONOS PCT: 8.4 % (ref 3.0–12.0)
Neutro Abs: 5 10*3/uL (ref 1.4–7.7)
Neutrophils Relative %: 60.4 % (ref 43.0–77.0)
Platelets: 260 10*3/uL (ref 150.0–400.0)
RBC: 5.4 Mil/uL — AB (ref 3.87–5.11)
RDW: 13.4 % (ref 11.5–15.5)
WBC: 8.2 10*3/uL (ref 4.0–10.5)

## 2017-06-12 LAB — COMPREHENSIVE METABOLIC PANEL
ALT: 13 U/L (ref 0–35)
AST: 15 U/L (ref 0–37)
Albumin: 4.2 g/dL (ref 3.5–5.2)
Alkaline Phosphatase: 57 U/L (ref 39–117)
BILIRUBIN TOTAL: 0.8 mg/dL (ref 0.2–1.2)
BUN: 10 mg/dL (ref 6–23)
CO2: 35 mEq/L — ABNORMAL HIGH (ref 19–32)
Calcium: 9.7 mg/dL (ref 8.4–10.5)
Chloride: 100 mEq/L (ref 96–112)
Creatinine, Ser: 0.8 mg/dL (ref 0.40–1.20)
GFR: 73.77 mL/min (ref >60.00)
Glucose, Bld: 96 mg/dL (ref 70–99)
POTASSIUM: 3.5 meq/L (ref 3.5–5.1)
SODIUM: 143 meq/L (ref 135–145)
TOTAL PROTEIN: 7.3 g/dL (ref 6.0–8.3)

## 2017-06-12 LAB — T4, FREE: Free T4: 1.3 ng/dL (ref 0.60–1.60)

## 2017-06-12 LAB — LIPID PANEL
CHOLESTEROL: 211 mg/dL — AB (ref 0–200)
HDL: 53.2 mg/dL (ref >39.00)
LDL Cholesterol: 123 mg/dL — ABNORMAL HIGH (ref 0–99)
NonHDL: 157.83
Total CHOL/HDL Ratio: 4
Triglycerides: 174 mg/dL — ABNORMAL HIGH (ref 0.0–149.0)
VLDL: 34.8 mg/dL (ref 0.0–40.0)

## 2017-06-12 LAB — T3, FREE: T3, Free: 3.7 pg/mL (ref 2.3–4.2)

## 2017-06-12 LAB — URIC ACID: Uric Acid, Serum: 5.9 mg/dL (ref 2.4–7.0)

## 2017-06-12 LAB — TSH: TSH: 0.05 u[IU]/mL — ABNORMAL LOW (ref 0.35–4.50)

## 2017-06-12 LAB — HEMOGLOBIN A1C: HEMOGLOBIN A1C: 5.6 % (ref 4.6–6.5)

## 2017-06-12 LAB — VITAMIN D 25 HYDROXY (VIT D DEFICIENCY, FRACTURES): VITD: 31.01 ng/mL (ref 30.00–100.00)

## 2017-06-12 NOTE — Progress Notes (Signed)
Pre visit review using our clinic review tool, if applicable. No additional management support is needed unless otherwise documented below in the visit note. 

## 2017-06-12 NOTE — Telephone Encounter (Signed)
-----   Message from Eustace Pen, LPN sent at [id number]/18/8416  4:36 PM EST ----- Regarding: Labs 1/3 Lab orders needed. Thank you.  Insurance:  [employer]

## 2017-06-12 NOTE — Progress Notes (Signed)
PCP notes:   Health maintenance:  Tetanus vaccine - postponed/insurance  Abnormal screenings:   Hearing - failed  Hearing Screening   125Hz  250Hz  500Hz  1000Hz  2000Hz  3000Hz  4000Hz  6000Hz  8000Hz   Right ear:   0 0 0  0    Left ear:   0 0 40  0     Patient concerns:   None  Nurse concerns:  None  Next PCP appt:   06/19/17 @ 1115

## 2017-06-12 NOTE — Progress Notes (Signed)
I reviewed health advisor's note, was available for consultation, and agree with documentation and plan.   Signed,  Doshia Dalia T. Jozie Wulf, MD  

## 2017-06-12 NOTE — Progress Notes (Signed)
Subjective:   Gloria Lloyd is a 77 y.o. female who presents for Medicare Annual (Subsequent) preventive examination.  Review of Systems:  N/A Cardiac Risk Factors include: advanced age (>49men, >16 women);dyslipidemia;hypertension     Objective:     Vitals: BP 114/74 (BP Location: Right Arm, Patient Position: Sitting, Cuff Size: Normal)   Pulse (!) 59   Temp 98.5 F (36.9 C) (Oral)   Ht 5\' 5"  (1.651 m) Comment: no shoes  Wt 148 lb 8 oz (67.4 kg)   SpO2 97%   BMI 24.71 kg/m   Body mass index is 24.71 kg/m.  Advanced Directives 06/12/2017 05/24/2016 05/10/2016  Does Patient Have a Medical Advance Directive? No No No  Would patient like information on creating a medical advance directive? No - Patient declined - No - Patient declined    Tobacco Social History   Tobacco Use  Smoking Status Former Smoker  Smokeless Tobacco Never Used     Counseling given: No   Clinical Intake:  Pre-visit preparation completed: Yes  Pain : No/denies pain Pain Score: 0-No pain     Nutritional Status: BMI of 19-24  Normal Nutritional Risks: None Diabetes: No  How often do you need to have someone help you when you read instructions, pamphlets, or other written materials from your doctor or pharmacy?: 4 - Often What is the last grade level you completed in school?: 12th grade  Interpreter Needed?: No  Comments: pt lives with spouse Information entered by :: LPinson, LPN  Past Medical History:  Diagnosis Date  . Adenomatous polyp of colon 04/2005  . Dementia   . Diverticulosis   . Erosive esophagitis   . Gout   . Hiatal hernia   . Hyperlipidemia   . Hypertension   . Thyroid disease    Past Surgical History:  Procedure Laterality Date  . THYROIDECTOMY, PARTIAL    . TONSILLECTOMY     Family History  Problem Relation Age of Onset  . Diabetes Mother   . Hyperlipidemia Mother   . Hypertension Mother   . Hyperthyroidism Mother   . Coronary artery disease Maternal  Grandmother    Social History   Socioeconomic History  . Marital status: Married    Spouse name: None  . Number of children: 2  . Years of education: None  . Highest education level: None  Social Needs  . Financial resource strain: None  . Food insecurity - worry: None  . Food insecurity - inability: None  . Transportation needs - medical: None  . Transportation needs - non-medical: None  Occupational History  . Occupation: retired    Fish farm manager: RETIRED  Tobacco Use  . Smoking status: Former Research scientist (life sciences)  . Smokeless tobacco: Never Used  Substance and Sexual Activity  . Alcohol use: Yes    Alcohol/week: 0.0 oz    Comment: occasional  . Drug use: No  . Sexual activity: Not Currently  Other Topics Concern  . None  Social History Narrative   Regular exercise--no, walking some.   Diet: fruit and veggies, water   No living will, HCPOA is husband, full code (reviewed 2014)                   Outpatient Encounter Medications as of 06/12/2017  Medication Sig  . amLODipine (NORVASC) 5 MG tablet TAKE 1 TABLET BY MOUTH EVERY DAY  . aspirin 81 MG tablet Take 81 mg by mouth daily as needed.   Marland Kitchen atenolol (TENORMIN) 25 MG tablet TAKE 1  TABLET BY MOUTH DAILY  . Cholecalciferol (VITAMIN D) 1000 UNITS capsule Take 1,000 Units by mouth 2 (two) times daily. Reported on 11/21/2015  . donepezil (ARICEPT) 10 MG tablet TAKE 1 TABLET BY MOUTH AT BEDTIME  . memantine (NAMENDA) 5 MG tablet Take 1 tablet (5 mg total) by mouth 2 (two) times daily.  . Omega-3 Fatty Acids (FISH OIL) 1000 MG CAPS Take 1 capsule by mouth daily as needed. Reported on 11/21/2015  . SYNTHROID 112 MCG tablet TAKE 1 TABLET BY MOUTH EVERY DAY BEFORE BREAKFAST  . vitamin B-12 (CYANOCOBALAMIN) 1000 MCG tablet Take 1 tablet (1,000 mcg total) by mouth daily. (Patient taking differently: Take 1,000 mcg by mouth as needed. )   No facility-administered encounter medications on file as of 06/12/2017.     Activities of Daily Living In  your present state of health, do you have any difficulty performing the following activities: 06/12/2017  Hearing? N  Vision? N  Difficulty concentrating or making decisions? Y  Walking or climbing stairs? N  Dressing or bathing? N  Doing errands, shopping? Y  Preparing Food and eating ? Y  Using the Toilet? N  In the past six months, have you accidently leaked urine? N  Do you have problems with loss of bowel control? N  Managing your Medications? Y  Managing your Finances? Y  Housekeeping or managing your Housekeeping? Y  Some recent data might be hidden    Patient Care Team: Jinny Sanders, MD as PCP - General Katy Apo, MD as Consulting Physician (Ophthalmology)    Assessment:   This is a routine wellness examination for Gloria Lloyd.   Hearing Screening   125Hz  250Hz  500Hz  1000Hz  2000Hz  3000Hz  4000Hz  6000Hz  8000Hz   Right ear:   0 0 0  0    Left ear:   0 0 40  0    Vision Screening Comments: Last vision exam in Feb 2018 with Dr. Prudencio Burly   Exercise Activities and Dietary recommendations Current Exercise Habits: The patient does not participate in regular exercise at present, Exercise limited by: None identified  Goals    . DIET - INCREASE WATER INTAKE     Starting 06/12/2017, I will continue to drink 3-4 glasses of water daily along with other liquids to prevent dehydration.        Fall Risk Fall Risk  06/12/2017 05/24/2016 05/23/2015 01/20/2014 12/09/2012  Falls in the past year? No No No No No   Depression Screen PHQ 2/9 Scores 06/12/2017 05/24/2016 05/23/2015 01/20/2014  PHQ - 2 Score 0 0 0 0  PHQ- 9 Score 0 - - -     Cognitive Function MMSE - Mini Mental State Exam 06/12/2017 05/24/2016  Not completed: DX: dementia (No Data)        Immunization History  Administered Date(s) Administered  . Influenza Split 04/01/2012  . Influenza Whole 04/06/2007, 03/09/2008, 03/16/2009  . Influenza,inj,Quad PF,6+ Mos 02/19/2013, 03/29/2014, 03/16/2015, 03/19/2016, 03/26/2017  .  Pneumococcal Conjugate-13 05/23/2015  . Pneumococcal Polysaccharide-23 06/11/2003, 06/07/2016  . Td 11/09/2006    Screening Tests Health Maintenance  Topic Date Due  . TETANUS/TDAP  06/12/2018 (Originally 11/08/2016)  . COLONOSCOPY  04/08/2018  . INFLUENZA VACCINE  Completed  . DEXA SCAN  Completed  . PNA vac Low Risk Adult  Completed        Plan:     I have personally reviewed, addressed, and noted the following in the patient's chart:  A. Medical and social history B. Use of alcohol, tobacco or  illicit drugs  C. Current medications and supplements D. Functional ability and status E.  Nutritional status F.  Physical activity G. Advance directives H. List of other physicians I.  Hospitalizations, surgeries, and ER visits in previous 12 months J.  [place] to include hearing, vision, cognitive, depression L. Referrals and appointments - none  In addition, I have reviewed and discussed with patient certain preventive protocols, quality metrics, and best practice recommendations. A written personalized care plan for preventive services as well as general preventive health recommendations were provided to patient.  See attached scanned questionnaire for additional information.   Signed,   Lindell Noe, MHA, BS, LPN Health Coach

## 2017-06-12 NOTE — Patient Instructions (Signed)
Gloria Lloyd , Thank you for taking time to come for your Medicare Wellness Visit. I appreciate your ongoing commitment to your health goals. Please review the following plan we discussed and let me know if I can assist you in the future.   These are the goals we discussed: Goals    . DIET - INCREASE WATER INTAKE     Starting 06/12/2017, I will continue to drink 3-4 glasses of water daily along with other liquids to prevent dehydration.        This is a list of the screening recommended for you and due dates:  Health Maintenance  Topic Date Due  . Tetanus Vaccine  06/12/2018*  . Colon Cancer Screening  04/08/2018  . Flu Shot  Completed  . DEXA scan (bone density measurement)  Completed  . Pneumonia vaccines  Completed  *Topic was postponed. The date shown is not the original due date.   Preventive Care for Adults  A healthy lifestyle and preventive care can promote health and wellness. Preventive health guidelines for adults include the following key practices.  . A routine yearly physical is a good way to check with your health care provider about your health and preventive screening. It is a chance to share any concerns and updates on your health and to receive a thorough exam.  . Visit your dentist for a routine exam and preventive care every 6 months. Brush your teeth twice a day and floss once a day. Good oral hygiene prevents tooth decay and gum disease.  . The frequency of eye exams is based on your age, health, family medical history, use  of contact lenses, and other factors. Follow your health care provider's recommendations for frequency of eye exams.  . Eat a healthy diet. Foods like vegetables, fruits, whole grains, low-fat dairy products, and lean protein foods contain the nutrients you need without too many calories. Decrease your intake of foods high in solid fats, added sugars, and salt. Eat the right amount of calories for you. Get information about a proper diet from  your health care provider, if necessary.  . Regular physical exercise is one of the most important things you can do for your health. Most adults should get at least 150 minutes of moderate-intensity exercise (any activity that increases your heart rate and causes you to sweat) each week. In addition, most adults need muscle-strengthening exercises on 2 or more days a week.  Silver Sneakers may be a benefit available to you. To determine eligibility, you may visit the website: www.silversneakers.com or contact program at 346-512-2412 Mon-Fri between 8AM-8PM.   . Maintain a healthy weight. The body mass index (BMI) is a screening tool to identify possible weight problems. It provides an estimate of body fat based on height and weight. Your health care provider can find your BMI and can help you achieve or maintain a healthy weight.   For adults 20 years and older: ? A BMI below 18.5 is considered underweight. ? A BMI of 18.5 to 24.9 is normal. ? A BMI of 25 to 29.9 is considered overweight. ? A BMI of 30 and above is considered obese.   . Maintain normal blood lipids and cholesterol levels by exercising and minimizing your intake of saturated fat. Eat a balanced diet with plenty of fruit and vegetables. Blood tests for lipids and cholesterol should begin at age 51 and be repeated every 5 years. If your lipid or cholesterol levels are high, you are over 50, or  you are at high risk for heart disease, you may need your cholesterol levels checked more frequently. Ongoing high lipid and cholesterol levels should be treated with medicines if diet and exercise are not working.  . If you smoke, find out from your health care provider how to quit. If you do not use tobacco, please do not start.  . If you choose to drink alcohol, please do not consume more than 2 drinks per day. One drink is considered to be 12 ounces (355 mL) of beer, 5 ounces (148 mL) of wine, or 1.5 ounces (44 mL) of liquor.  . If you  are 46-56 years old, ask your health care provider if you should take aspirin to prevent strokes.  . Use sunscreen. Apply sunscreen liberally and repeatedly throughout the day. You should seek shade when your shadow is shorter than you. Protect yourself by wearing long sleeves, pants, a wide-brimmed hat, and sunglasses year round, whenever you are outdoors.  . Once a month, do a whole body skin exam, using a mirror to look at the skin on your back. Tell your health care provider of new moles, moles that have irregular borders, moles that are larger than a pencil eraser, or moles that have changed in shape or color.

## 2017-06-19 ENCOUNTER — Ambulatory Visit (INDEPENDENT_AMBULATORY_CARE_PROVIDER_SITE_OTHER): Payer: Medicare Other | Admitting: Family Medicine

## 2017-06-19 ENCOUNTER — Other Ambulatory Visit: Payer: Self-pay

## 2017-06-19 ENCOUNTER — Encounter: Payer: Self-pay | Admitting: Family Medicine

## 2017-06-19 VITALS — BP 120/70 | HR 56 | Temp 98.7°F | Ht 65.0 in | Wt 149.5 lb

## 2017-06-19 DIAGNOSIS — R55 Syncope and collapse: Secondary | ICD-10-CM | POA: Insufficient documentation

## 2017-06-19 DIAGNOSIS — F0390 Unspecified dementia without behavioral disturbance: Secondary | ICD-10-CM

## 2017-06-19 DIAGNOSIS — Z0001 Encounter for general adult medical examination with abnormal findings: Secondary | ICD-10-CM | POA: Diagnosis not present

## 2017-06-19 DIAGNOSIS — R001 Bradycardia, unspecified: Secondary | ICD-10-CM

## 2017-06-19 DIAGNOSIS — E039 Hypothyroidism, unspecified: Secondary | ICD-10-CM

## 2017-06-19 DIAGNOSIS — Z0000 Encounter for general adult medical examination without abnormal findings: Secondary | ICD-10-CM

## 2017-06-19 DIAGNOSIS — F03B0 Unspecified dementia, moderate, without behavioral disturbance, psychotic disturbance, mood disturbance, and anxiety: Secondary | ICD-10-CM

## 2017-06-19 MED ORDER — LEVOTHYROXINE SODIUM 112 MCG PO TABS: 112.0000 ug | ORAL_TABLET | Freq: Every day | ORAL | 3 refills | Status: DC

## 2017-06-19 NOTE — Progress Notes (Signed)
   Subjective:    Patient ID: Gloria Lloyd, female    DOB: 07-27-1940, 77 y.o.   MRN: [id number]  HPI    Review of Systems     Objective:   Physical Exam        Assessment & Plan:

## 2017-06-19 NOTE — Progress Notes (Signed)
Subjective:    Patient ID: Gloria Lloyd, female    DOB: January 03, 1941, 77 y.o.   MRN: [id number]  HPI   The patient presents for complete physical and review of chronic health problems.   The patient saw Candis Musa, LPN for medicare wellness. Note reviewed in detail and important notes copied below. Abnormal screenings:   Hearing - failed             Hearing Screening   125Hz  250Hz  500Hz  1000Hz  2000Hz  3000Hz  4000Hz  6000Hz  8000Hz   Right ear:   0 0 0  0    Left ear:   0 0 40  0     Patient concerns:   None   Today 06/19/17  Hypertension:   Good  Control.  Using medication without problems or lightheadedness:  Chest pain with exertion:none Edema:none Short of breath: none Average home BPs: running 130/70 at home Other issues:   Hypothyroid:   Lab Results  Component Value Date   TSH 0.05 (L) 06/12/2017      Dementia:   On aricept and namenbda.   He feels that namenda made her memory worse, more beligerent.Marland Kitchen He cut it down.. Minimal improvement.  Restarted back  .She continues to have worsening memory. She does not like  Showering, ADLs able to do but does not want to, does not want to get out of bed.  She is eating well, good appetite. She has been having dizziness off and on, cold sweat presyncope after getting out of bed. Improved real quick after rest and  OJ. Two  episode of unresponsive in car ( LOC for few minutes) in last year.  She is argumentative if husband pushes anything She is not paranoid (she does hide things per daughter), wandering.  Social History /Family History/Past Medical History reviewed in detail and updated in EMR if needed. Blood pressure 120/70, pulse (!) 56, temperature 98.7 F (37.1 C), temperature source Oral, height 5\' 5"  (1.651 m), weight 149 lb 8 oz (67.8 kg).     Review of Systems  Constitutional: Negative for fatigue and fever.  HENT: Negative for congestion.   Eyes: Negative for pain.  Respiratory:  Negative for cough and shortness of breath.   Cardiovascular: Negative for chest pain, palpitations and leg swelling.  Gastrointestinal: Negative for abdominal pain.  Genitourinary: Negative for dysuria and vaginal bleeding.  Musculoskeletal: Negative for back pain.  Neurological: Positive for syncope. Negative for light-headedness and headaches.  Psychiatric/Behavioral: Negative for dysphoric mood.       Objective:   Physical Exam  Constitutional: Vital signs are normal. She appears well-developed and well-nourished. She is cooperative.  Non-toxic appearance. She does not appear ill. No distress.  HENT:  Head: Normocephalic.  Right Ear: Hearing, tympanic membrane, external ear and ear canal normal. Tympanic membrane is not erythematous, not retracted and not bulging.  Left Ear: Hearing, tympanic membrane, external ear and ear canal normal. Tympanic membrane is not erythematous, not retracted and not bulging.  Nose: No mucosal edema or rhinorrhea. Right sinus exhibits no maxillary sinus tenderness and no frontal sinus tenderness. Left sinus exhibits no maxillary sinus tenderness and no frontal sinus tenderness.  Mouth/Throat: Uvula is midline, oropharynx is clear and moist and mucous membranes are normal.  Eyes: Conjunctivae, EOM and lids are normal. Pupils are equal, round, and reactive to light. Lids are everted and swept, no foreign bodies found.  Neck: Trachea normal and normal range of motion. Neck supple. Carotid bruit is not present. No thyroid mass  and no thyromegaly present.  Cardiovascular: Normal rate, regular rhythm, S1 normal, S2 normal, normal heart sounds, intact distal pulses and normal pulses. Exam reveals no gallop and no friction rub.  No murmur heard. Pulmonary/Chest: Effort normal and breath sounds normal. No tachypnea. No respiratory distress. She has no decreased breath sounds. She has no wheezes. She has no rhonchi. She has no rales.  Abdominal: Soft. Normal appearance  and bowel sounds are normal. There is no tenderness.  Neurological: She is alert.  Skin: Skin is warm, dry and intact. No rash noted.  Psychiatric: Her speech is normal and behavior is normal. Thought content normal. Her mood appears not anxious. Cognition and memory are impaired. She expresses impulsivity. She does not exhibit a depressed mood. She exhibits abnormal recent memory.          Assessment & Plan:  The patient's preventative maintenance and recommended screening tests for an annual wellness exam were reviewed in full today. Brought up to date unless services declined.  Counselled on the importance of diet, exercise, and its role in overall health and mortality. The patient's FH and SH was reviewed, including their home life, tobacco status, and drug and alcohol status.   Vaccines: consider tdap Refused shingles Pap/DVE: Not indicated, No family history of uterine or ovarian cancer, asymptomatic.  Not indicate DVE. Mammo:  Not indicated Bone Density:04/2011 osteopenia in hip, nml in spine, plan repeat in 5 years.  Colon: 2014 , polyps plan repeat in 5 years. Dr.Stark.   Smoking Status:None ETOH/ drug ZOX:WRUE

## 2017-06-19 NOTE — Patient Instructions (Addendum)
Stop namenda.  Continue aricept.  Call if interested in neurology appt  Change to generic levothyroxine.  Return in 4 weeks for thyroid testing. Stop atenolol.. Follow BP and pulse at home.. Call in 2 weeks with measurements. Call sooner if repeat passing out spell.

## 2017-07-15 NOTE — Assessment & Plan Note (Signed)
Family wishes to change to generic levothyroxine for cost reasons.

## 2017-07-15 NOTE — Assessment & Plan Note (Signed)
Stop namenda given SE.. Made memory and attitude worse.  Continue aricept.

## 2017-07-15 NOTE — Assessment & Plan Note (Signed)
Several unresponsive episodes.  EKG today showed significant bradycardia.  This may be cause of issue. Stop atenolol.. Follow BP and pulse at home.. Call in 2 weeks with measurements. Call sooner if repeat passing out spell... May need cards referral.

## 2017-07-17 ENCOUNTER — Telehealth: Payer: Self-pay | Admitting: Family Medicine

## 2017-07-17 ENCOUNTER — Other Ambulatory Visit (INDEPENDENT_AMBULATORY_CARE_PROVIDER_SITE_OTHER): Payer: Medicare Other

## 2017-07-17 DIAGNOSIS — E039 Hypothyroidism, unspecified: Secondary | ICD-10-CM

## 2017-07-17 LAB — TSH: TSH: 0.01 u[IU]/mL — ABNORMAL LOW (ref 0.35–4.50)

## 2017-07-17 LAB — T3, FREE: T3 FREE: 4.2 pg/mL (ref 2.3–4.2)

## 2017-07-17 LAB — T4, FREE: FREE T4: 1.9 ng/dL — AB (ref 0.60–1.60)

## 2017-07-17 MED ORDER — LEVOTHYROXINE SODIUM 100 MCG PO TABS: 100.0000 ug | ORAL_TABLET | Freq: Every day | ORAL | 11 refills | Status: DC

## 2017-07-17 NOTE — Progress Notes (Signed)
Erroneous encounter

## 2017-07-17 NOTE — Telephone Encounter (Signed)
The new form of thyroid medication is  a little to strong for her.. please have her decrease to 100 mcg daily.. Make appt for free t3 , free t4 and tsh in 4 weeks. Labs ordered

## 2017-07-17 NOTE — Telephone Encounter (Signed)
-----   Message from Ellamae Sia sent at 07/09/2017 10:35 AM EST ----- Regarding: Lab orders for Thursday, 2.7.19 Lab order for a 4 week f/u

## 2017-07-17 NOTE — Addendum Note (Signed)
Addended by: Eliezer Lofts E on: 07/17/2017 05:14 PM   Modules accepted: Orders

## 2017-08-12 ENCOUNTER — Other Ambulatory Visit: Payer: Self-pay | Admitting: Family Medicine

## 2017-08-14 ENCOUNTER — Ambulatory Visit: Payer: Medicare Other | Admitting: Family Medicine

## 2017-08-14 ENCOUNTER — Other Ambulatory Visit: Payer: Medicare Other

## 2017-08-18 ENCOUNTER — Other Ambulatory Visit (INDEPENDENT_AMBULATORY_CARE_PROVIDER_SITE_OTHER): Payer: Medicare Other

## 2017-08-18 DIAGNOSIS — E039 Hypothyroidism, unspecified: Secondary | ICD-10-CM | POA: Diagnosis not present

## 2017-08-19 ENCOUNTER — Encounter: Payer: Self-pay | Admitting: *Deleted

## 2017-08-19 LAB — TSH: TSH: 0.02 u[IU]/mL — AB (ref 0.35–4.50)

## 2017-08-19 LAB — T4, FREE: FREE T4: 1.48 ng/dL (ref 0.60–1.60)

## 2017-08-19 LAB — T3, FREE: T3, Free: 3.1 pg/mL (ref 2.3–4.2)

## 2017-11-20 ENCOUNTER — Other Ambulatory Visit: Payer: Self-pay | Admitting: Family Medicine

## 2017-12-18 ENCOUNTER — Encounter: Payer: Self-pay | Admitting: Neurology

## 2017-12-18 ENCOUNTER — Ambulatory Visit (INDEPENDENT_AMBULATORY_CARE_PROVIDER_SITE_OTHER): Payer: Medicare Other | Admitting: Family Medicine

## 2017-12-18 ENCOUNTER — Encounter: Payer: Self-pay | Admitting: Family Medicine

## 2017-12-18 VITALS — BP 120/70 | HR 71 | Temp 98.8°F | Ht 65.0 in | Wt 147.2 lb

## 2017-12-18 DIAGNOSIS — R001 Bradycardia, unspecified: Secondary | ICD-10-CM | POA: Diagnosis not present

## 2017-12-18 DIAGNOSIS — F0390 Unspecified dementia without behavioral disturbance: Secondary | ICD-10-CM | POA: Diagnosis not present

## 2017-12-18 DIAGNOSIS — I10 Essential (primary) hypertension: Secondary | ICD-10-CM

## 2017-12-18 DIAGNOSIS — E039 Hypothyroidism, unspecified: Secondary | ICD-10-CM | POA: Diagnosis not present

## 2017-12-18 DIAGNOSIS — L918 Other hypertrophic disorders of the skin: Secondary | ICD-10-CM | POA: Diagnosis not present

## 2017-12-18 DIAGNOSIS — F03B0 Unspecified dementia, moderate, without behavioral disturbance, psychotic disturbance, mood disturbance, and anxiety: Secondary | ICD-10-CM

## 2017-12-18 NOTE — Assessment & Plan Note (Signed)
Fairly stable per daughter on aricept and namenda . Daughter request referral to neurologist for further eval and any recommendations of med change.  Unremarkable labs and MRI in past.

## 2017-12-18 NOTE — Assessment & Plan Note (Signed)
Good control on amlodipine alone.

## 2017-12-18 NOTE — Progress Notes (Signed)
Subjective:    Patient ID: Gloria Lloyd, female    DOB: [birth year], 77 y.o.   MRN: [id number]  HPI 77 year old female presents for  6 month follow up  Dementia. Husnad and daughter here with her.   Dementia, moderate: On Aricept and Namenda. No significant decline.   At last OV stopped atenolol given significant bradycardia and severe syncopal episodes.  Per family no further spells of passing out.  BP controlled on amiodarone.   Eating well overall.   Still spending a lot of time in bed. Does not like to shower.  Nonsensical speech sometimes. Wt Readings from Last 3 Encounters:  12/18/17 147 lb 4 oz (66.8 kg)  06/19/17 149 lb 8 oz (67.8 kg)  06/12/17 148 lb 8 oz (67.4 kg)    She has skin tag on left upper eyelid that she would like to have removed.  Blood pressure 120/70, pulse 71, temperature 98.8 F (37.1 C), temperature source Oral, height 5\' 5"  (1.651 m), weight 147 lb 4 oz (66.8 kg). Social History /Family History/Past Medical History reviewed in detail and updated in EMR if needed.  Review of Systems  Constitutional: Negative for fatigue and fever.  HENT: Negative for congestion.   Eyes: Negative for pain.  Respiratory: Negative for cough and shortness of breath.   Cardiovascular: Negative for chest pain, palpitations and leg swelling.  Gastrointestinal: Negative for abdominal pain.  Genitourinary: Negative for dysuria and vaginal bleeding.  Musculoskeletal: Negative for back pain.  Neurological: Positive for syncope. Negative for light-headedness and headaches.  Psychiatric/Behavioral: Negative for dysphoric mood.       Objective:   Physical Exam  Constitutional: Vital signs are normal. She appears well-developed and well-nourished. She is cooperative.  Non-toxic appearance. She does not appear ill. No distress.  HENT:  Head: Normocephalic.  Right Ear: Hearing, tympanic membrane, external ear and ear canal normal. Tympanic membrane is not erythematous,  not retracted and not bulging.  Left Ear: Hearing, tympanic membrane, external ear and ear canal normal. Tympanic membrane is not erythematous, not retracted and not bulging.  Nose: No mucosal edema or rhinorrhea. Right sinus exhibits no maxillary sinus tenderness and no frontal sinus tenderness. Left sinus exhibits no maxillary sinus tenderness and no frontal sinus tenderness.  Mouth/Throat: Uvula is midline, oropharynx is clear and moist and mucous membranes are normal.  Eyes: Pupils are equal, round, and reactive to light. Conjunctivae, EOM and lids are normal. Lids are everted and swept, no foreign bodies found.  Neck: Trachea normal and normal range of motion. Neck supple. Carotid bruit is not present. No thyroid mass and no thyromegaly present.  Cardiovascular: Normal rate, regular rhythm, S1 normal, S2 normal, normal heart sounds, intact distal pulses and normal pulses. Exam reveals no gallop and no friction rub.  No murmur heard. Pulmonary/Chest: Effort normal and breath sounds normal. No tachypnea. No respiratory distress. She has no decreased breath sounds. She has no wheezes. She has no rhonchi. She has no rales.  Abdominal: Soft. Normal appearance and bowel sounds are normal. There is no tenderness.  Neurological: She is alert.  Skin: Skin is warm, dry and intact. No rash noted.  Skin tag left eyelid... Removed easily with cold spray, alcohol prep and scissors.  Minimal bleeding controlled with pressure.  Psychiatric: Her speech is normal and behavior is normal. Judgment and thought content normal. Her mood appears not anxious. Cognition and memory are normal. She does not exhibit a depressed mood.  Assessment & Plan:

## 2017-12-18 NOTE — Assessment & Plan Note (Signed)
Resolved with stopping atenolol.

## 2017-12-18 NOTE — Patient Instructions (Signed)
Please stop at the front desk to set up referral.  

## 2017-12-18 NOTE — Assessment & Plan Note (Signed)
Good control on generic levo per check in 3.2019

## 2018-02-19 ENCOUNTER — Encounter: Payer: Self-pay | Admitting: Neurology

## 2018-02-19 ENCOUNTER — Other Ambulatory Visit (INDEPENDENT_AMBULATORY_CARE_PROVIDER_SITE_OTHER): Payer: Medicare Other

## 2018-02-19 ENCOUNTER — Ambulatory Visit: Payer: Medicare Other | Admitting: Neurology

## 2018-02-19 ENCOUNTER — Other Ambulatory Visit: Payer: Self-pay

## 2018-02-19 VITALS — BP 110/72 | HR 80 | Ht 66.0 in | Wt 147.0 lb

## 2018-02-19 DIAGNOSIS — F0391 Unspecified dementia with behavioral disturbance: Secondary | ICD-10-CM

## 2018-02-19 DIAGNOSIS — F03B18 Unspecified dementia, moderate, with other behavioral disturbance: Secondary | ICD-10-CM

## 2018-02-19 DIAGNOSIS — R55 Syncope and collapse: Secondary | ICD-10-CM | POA: Diagnosis not present

## 2018-02-19 NOTE — Patient Instructions (Addendum)
1. Check B12 level 2. Continue Donepezil 10mg  daily 3. Encourage going to the senior center and joining day programs 4. Follow-up in 6 months, call for any changes  FALL PRECAUTIONS: Be cautious when walking. Scan the area for obstacles that may increase the risk of trips and falls. When getting up in the mornings, sit up at the edge of the bed for a few minutes before getting out of bed. Consider elevating the bed at the head end to avoid drop of blood pressure when getting up. Walk always in a well-lit room (use night lights in the walls). Avoid area rugs or power cords from appliances in the middle of the walkways. Use a walker or a cane if necessary and consider physical therapy for balance exercise. Get your eyesight checked regularly.  FINANCIAL OVERSIGHT: Supervision, especially oversight when making financial decisions or transactions is also recommended.  HOME SAFETY: Consider the safety of the kitchen when operating appliances like stoves, microwave oven, and blender. Consider having supervision and share cooking responsibilities until no longer able to participate in those. Accidents with firearms and other hazards in the house should be identified and addressed as well.  DRIVING: Regarding driving, in patients with progressive memory problems, driving will be impaired. We advise to have someone else do the driving if trouble finding directions or if minor accidents are reported. Independent driving assessment is available to determine safety of driving.  ABILITY TO BE LEFT ALONE: If patient is unable to contact 911 operator, consider using LifeLine, or when the need is there, arrange for someone to stay with patients. Smoking is a fire hazard, consider supervision or cessation. Risk of wandering should be assessed by caregiver and if detected at any point, supervision and safe proof recommendations should be instituted.  MEDICATION SUPERVISION: Inability to self-administer medication needs  to be constantly addressed. Implement a mechanism to ensure safe administration of the medications.  RECOMMENDATIONS FOR ALL PATIENTS WITH MEMORY PROBLEMS: 1. Continue to exercise (Recommend 30 minutes of walking everyday, or 3 hours every week) 2. Increase social interactions - continue going to [place] and enjoy social gatherings with friends and family 3. Eat healthy, avoid fried foods and eat more fruits and vegetables 4. Maintain adequate blood pressure, blood sugar, and blood cholesterol level. Reducing the risk of stroke and cardiovascular disease also helps promoting better memory. 5. Avoid stressful situations. Live a simple life and avoid aggravations. Organize your time and prepare for the next day in anticipation. 6. Sleep well, avoid any interruptions of sleep and avoid any distractions in the bedroom that may interfere with adequate sleep quality 7. Avoid sugar, avoid sweets as there is a strong link between excessive sugar intake, diabetes, and cognitive impairment The Mediterranean diet has been shown to help patients reduce the risk of progressive memory disorders and reduces cardiovascular risk. This includes eating fish, eat fruits and green leafy vegetables, nuts like almonds and hazelnuts, walnuts, and also use olive oil. Avoid fast foods and fried foods as much as possible. Avoid sweets and sugar as sugar use has been linked to worsening of memory function.  There is always a concern of gradual progression of memory problems. If this is the case, then we may need to adjust level of care according to patient needs. Support, both to the patient and caregiver, should then be put into place.

## 2018-02-19 NOTE — Progress Notes (Signed)
NEUROLOGY CONSULTATION NOTE  [name] MRN: [id number] DOB: July 14, 1940  Referring provider: Dr. Eliezer Lofts Primary care provider: Dr. Eliezer Lofts  Reason for consult:  dementia  Dear Dr Diona Browner:  Thank you for your kind referral of Gloria Lloyd for consultation of the above symptoms. Although her history is well known to you, please allow me to reiterate it for the purpose of our medical record. The patient was accompanied to the clinic by her daughter [name] who also provides collateral information. Records and images were personally reviewed where available.  HISTORY OF PRESENT ILLNESS: This is a 77 year old right-handed woman with a history of hypertension, hyperlipidemia, hypothyroidism s/p thyroidectomy, presenting for evaluation of dementia. She states her memory is "fine, I don't have trouble remembering anything." She is noted to laugh and make jokes several times during the visit, sometimes with tangential responses. She lives with her husband. Olivia Mackie, her daughter, started noticing changes 3.5 years ago, stating memory changes seemed to come on suddenly after her best friend passed away from cancer, however looking back she was having minor issues such as saying she would get this great bread all the time, but her husband reported she they never got that bread. She states she is driving, Tracy shakes her head and reports she stopped driving 3 years ago, she was at a grocery and did not know why she was there. Her husband manages meals, finances and puts her medications in cups for her. She is independent with dressing and bathing. Her daughter feels she is a little paranoid, such as thinking why she is in today's appointment. There was only one time where she may have had hallucinations, she looked outside the house and asked why her husband was outside with no clothes on. She has had some personality changes, one time she was with her daughter and started yelling about  their dog, then she came back and there was "a complete turnaround." Her daughter reports she is "sort of OCD," she takes clothes out of the dresser, folds them on the bed in a stack, like a little squirrel, finds things and puts them in stacks." They find things all over the place. Her maternal grandparents had "senility." When asked about them, she answers "they are always in our lives, I'm glad they are able to be that way." No history of significant head injuries, she rarely drinks alcohol. She is taking Aricept 10mg  daily. Her husband noticed personality changes with the Namenda and stopped it.   She denies any headaches, dizziness, diplopia, dysarthria, dysphagia, neck/back pain, focal numbness/tingling/weakness, bowel/bladder dysfunction. No anosmia, tremors, no falls.   I personally reviewed MRI brain without contrast done 02/2015 which did not show any acute changes. There was mildly disproportionate volume loss of the amygdala, greater on the right. Mild chronic microvascular disease.   Laboratory Data:  Lab Results  Component Value Date   TSH 0.02 (L) 08/18/2017   Lab Results  Component Value Date   VITAMINB12 165 (L) 08/30/2014     PAST MEDICAL HISTORY: Past Medical History:  Diagnosis Date  . Adenomatous polyp of colon 04/2005  . Dementia   . Diverticulosis   . Erosive esophagitis   . Gout   . Hiatal hernia   . Hyperlipidemia   . Hypertension   . Thyroid disease     PAST SURGICAL HISTORY: Past Surgical History:  Procedure Laterality Date  . THYROIDECTOMY, PARTIAL    . TONSILLECTOMY  MEDICATIONS: Current Outpatient Medications on File Prior to Visit  Medication Sig Dispense Refill  . amLODipine (NORVASC) 5 MG tablet TAKE 1 TABLET BY MOUTH EVERY DAY 90 tablet 3  . donepezil (ARICEPT) 10 MG tablet TAKE 1 TABLET BY MOUTH AT BEDTIME 90 tablet 3  . levothyroxine (SYNTHROID, LEVOTHROID) 100 MCG tablet Take 1 tablet (100 mcg total) by mouth daily. 30 tablet 11    No current facility-administered medications on file prior to visit.     ALLERGIES: Allergies  Allergen Reactions  . Penicillins     REACTION: As a child  . Sulfonamide Derivatives     REACTION: Rash, itch.    FAMILY HISTORY: Family History  Problem Relation Age of Onset  . Diabetes Mother   . Hyperlipidemia Mother   . Hypertension Mother   . Hyperthyroidism Mother   . Coronary artery disease Maternal Grandmother     SOCIAL HISTORY: Social History   Socioeconomic History  . Marital status: Married    Spouse name: Not on file  . Number of children: 2  . Years of education: Not on file  . Highest education level: Not on file  Occupational History  . Occupation: retired    Fish farm manager: RETIRED  Social Needs  . Financial resource strain: Not on file  . Food insecurity:    Worry: Not on file    Inability: Not on file  . Transportation needs:    Medical: Not on file    Non-medical: Not on file  Tobacco Use  . Smoking status: Former Research scientist (life sciences)  . Smokeless tobacco: Never Used  Substance and Sexual Activity  . Alcohol use: Yes    Alcohol/week: 0.0 standard drinks    Comment: occasional  . Drug use: No  . Sexual activity: Not Currently  Lifestyle  . Physical activity:    Days per week: Not on file    Minutes per session: Not on file  . Stress: Not on file  Relationships  . Social connections:    Talks on phone: Not on file    Gets together: Not on file    Attends religious service: Not on file    Active member of club or organization: Not on file    Attends meetings of clubs or organizations: Not on file    Relationship status: Not on file  . Intimate partner violence:    Fear of current or ex partner: Not on file    Emotionally abused: Not on file    Physically abused: Not on file    Forced sexual activity: Not on file  Other Topics Concern  . Not on file  Social History Narrative   Regular exercise--no, walking some.   Diet: fruit and veggies, water    No living will, HCPOA is husband, full code (reviewed 2014)                   REVIEW OF SYSTEMS: Constitutional: No fevers, chills, or sweats, no generalized fatigue, change in appetite Eyes: No visual changes, double vision, eye pain Ear, nose and throat: No hearing loss, ear pain, nasal congestion, sore throat Cardiovascular: No chest pain, palpitations Respiratory:  No shortness of breath at rest or with exertion, wheezes GastrointestinaI: No nausea, vomiting, diarrhea, abdominal pain, fecal incontinence Genitourinary:  No dysuria, urinary retention or frequency Musculoskeletal:  No neck pain, back pain Integumentary: No rash, pruritus, skin lesions Neurological: as above Psychiatric: No depression, insomnia, anxiety Endocrine: No palpitations, fatigue, diaphoresis, mood swings, change in appetite, change  in weight, increased thirst Hematologic/Lymphatic:  No anemia, purpura, petechiae. Allergic/Immunologic: no itchy/runny eyes, nasal congestion, recent allergic reactions, rashes  PHYSICAL EXAM: Vitals:   02/19/18 1036  BP: 110/72  Pulse: 80  SpO2: 92%   General: No acute distress, tangential speech at times (when asked what color to remember, she answered "this is a comfortable place," laughing and making jokes throughout the visit Head:  Normocephalic/atraumatic Eyes: Fundoscopic exam shows bilateral sharp discs, no vessel changes, exudates, or hemorrhages Neck: supple, no paraspinal tenderness, full range of motion Back: No paraspinal tenderness Heart: regular rate and rhythm Lungs: Clear to auscultation bilaterally. Vascular: No carotid bruits. Skin/Extremities: No rash, no edema Neurological Exam: Mental status: alert and oriented to person, no dysarthria or aphasia, Fund of knowledge is reduced.  Recent and remote memory are impaired. Attention and concentration are reduced.    Able to name objects and repeat phrases.  MMSE - Mini Mental State Exam 02/19/2018  06/12/2017 05/24/2016  Not completed: - (No Data) (No Data)  Orientation to time 0 - -  Orientation to Place 0 - -  Registration 3 - -  Attention/ Calculation 0 - -  Recall 0 - -  Language- name 2 objects 2 - -  Language- repeat 1 - -  Language- follow 3 step command 0 - -  Language- read & follow direction 1 - -  Write a sentence 0 - -  Copy design 0 - -  Total score 7 - -   Cranial nerves: CN I: not tested CN II: pupils equal, round and reactive to light, visual fields intact, fundi unremarkable. CN III, IV, VI:  full range of motion, no nystagmus, no ptosis CN V: facial sensation intact CN VII: upper and lower face symmetric CN VIII: hearing intact to finger rub CN IX, X: gag intact, uvula midline CN XI: sternocleidomastoid and trapezius muscles intact CN XII: tongue midline Bulk & Tone: normal, + fasciculations.on both calves Motor: 5/5 throughout with no pronator drift. Sensation: intact to light touch, cold, pin, vibration and joint position sense.  No extinction to double simultaneous stimulation.  Romberg test negative Deep Tendon Reflexes: brisk +2 throughout, no ankle clonus, negative Hoffman sign Plantar responses: downgoing bilaterally Cerebellar: no incoordination on finger to nose testing Gait: narrow-based and steady, difficulty with tandem walk Tremor: none  IMPRESSION: This is a pleasant 77 year old right-handed woman with a history of hypertension, hyperlipidemia, hypothyroidism s/p thyroidectomy, presenting for evaluation of dementia. Her neurological exam is non-focal, MMSE today 9/30 indicating moderate to severe dementia. No significant behavioral changes, continue to monitor. We discussed MRI results from 2016, with volume loss in the temporal lobes. We discussed the diagnosis of Alzheimer's dementia, she is on Aricept, she had side effects on Namenda. Diagnosis, prognosis were discussed. She was encouraged to join day programs, continue to monitor home safety.  She had a low B12 level in the past, recheck level. She does not drive. Follow-up in 6 months, they know to call for any changes.   Thank you for allowing me to participate in the care of this patient. Please do not hesitate to call for any questions or concerns.   Ellouise Newer, M.D.  CC: Dr. Diona Browner

## 2018-02-20 ENCOUNTER — Telehealth: Payer: Self-pay | Admitting: *Deleted

## 2018-02-20 LAB — TIQ-NTM

## 2018-02-20 LAB — VITAMIN B12: Vitamin B-12: 583 pg/mL (ref 200–1100)

## 2018-02-20 NOTE — Telephone Encounter (Signed)
Patient's daughter given results.

## 2018-02-20 NOTE — Telephone Encounter (Signed)
-----   Message from Cameron Sprang, MD sent at 02/20/2018 12:13 PM EDT ----- Pls let daughter know the B12 level was normal, thanks

## 2018-04-06 ENCOUNTER — Encounter: Payer: Self-pay | Admitting: Gastroenterology

## 2018-05-14 ENCOUNTER — Ambulatory Visit (INDEPENDENT_AMBULATORY_CARE_PROVIDER_SITE_OTHER): Payer: Medicare Other

## 2018-05-14 DIAGNOSIS — Z23 Encounter for immunization: Secondary | ICD-10-CM | POA: Diagnosis not present

## 2018-06-25 ENCOUNTER — Ambulatory Visit: Payer: Medicare Other

## 2018-06-25 ENCOUNTER — Ambulatory Visit (INDEPENDENT_AMBULATORY_CARE_PROVIDER_SITE_OTHER): Payer: Medicare Other

## 2018-06-25 VITALS — BP 120/82 | HR 58 | Temp 98.6°F | Ht 66.0 in | Wt 143.0 lb

## 2018-06-25 DIAGNOSIS — Z0000 Encounter for general adult medical examination without abnormal findings: Secondary | ICD-10-CM | POA: Diagnosis not present

## 2018-06-25 DIAGNOSIS — M1000 Idiopathic gout, unspecified site: Secondary | ICD-10-CM

## 2018-06-25 DIAGNOSIS — I10 Essential (primary) hypertension: Secondary | ICD-10-CM

## 2018-06-25 DIAGNOSIS — E039 Hypothyroidism, unspecified: Secondary | ICD-10-CM | POA: Diagnosis not present

## 2018-06-25 DIAGNOSIS — M8580 Other specified disorders of bone density and structure, unspecified site: Secondary | ICD-10-CM | POA: Diagnosis not present

## 2018-06-25 DIAGNOSIS — E785 Hyperlipidemia, unspecified: Secondary | ICD-10-CM | POA: Diagnosis not present

## 2018-06-25 LAB — VITAMIN D 25 HYDROXY (VIT D DEFICIENCY, FRACTURES): VITD: 48.46 ng/mL (ref 30.00–100.00)

## 2018-06-25 LAB — CBC WITH DIFFERENTIAL/PLATELET
BASOS PCT: 1 % (ref 0.0–3.0)
Basophils Absolute: 0.1 10*3/uL (ref 0.0–0.1)
Eosinophils Absolute: 0.2 10*3/uL (ref 0.0–0.7)
Eosinophils Relative: 2.1 % (ref 0.0–5.0)
HEMATOCRIT: 50.6 % — AB (ref 36.0–46.0)
Hemoglobin: 16.9 g/dL — ABNORMAL HIGH (ref 12.0–15.0)
LYMPHS ABS: 2.2 10*3/uL (ref 0.7–4.0)
LYMPHS PCT: 25.5 % (ref 12.0–46.0)
MCHC: 33.5 g/dL (ref 30.0–36.0)
MCV: 89.5 fl (ref 78.0–100.0)
MONOS PCT: 8.1 % (ref 3.0–12.0)
Monocytes Absolute: 0.7 10*3/uL (ref 0.1–1.0)
NEUTROS ABS: 5.5 10*3/uL (ref 1.4–7.7)
NEUTROS PCT: 63.3 % (ref 43.0–77.0)
PLATELETS: 253 10*3/uL (ref 150.0–400.0)
RBC: 5.65 Mil/uL — ABNORMAL HIGH (ref 3.87–5.11)
RDW: 13.4 % (ref 11.5–15.5)
WBC: 8.6 10*3/uL (ref 4.0–10.5)

## 2018-06-25 LAB — COMPREHENSIVE METABOLIC PANEL
ALT: 14 U/L (ref 0–35)
AST: 17 U/L (ref 0–37)
Albumin: 4.2 g/dL (ref 3.5–5.2)
Alkaline Phosphatase: 56 U/L (ref 39–117)
BUN: 9 mg/dL (ref 6–23)
CO2: 31 mEq/L (ref 19–32)
Calcium: 10 mg/dL (ref 8.4–10.5)
Chloride: 102 mEq/L (ref 96–112)
Creatinine, Ser: 0.83 mg/dL (ref 0.40–1.20)
GFR: 66.35 mL/min (ref >60.00)
Glucose, Bld: 83 mg/dL (ref 70–99)
Potassium: 3.4 mEq/L — ABNORMAL LOW (ref 3.5–5.1)
Sodium: 142 mEq/L (ref 135–145)
Total Bilirubin: 1 mg/dL (ref 0.2–1.2)
Total Protein: 7.4 g/dL (ref 6.0–8.3)

## 2018-06-25 LAB — T4, FREE: Free T4: 1.48 ng/dL (ref 0.60–1.60)

## 2018-06-25 LAB — T3, FREE: T3, Free: 3.3 pg/mL (ref 2.3–4.2)

## 2018-06-25 LAB — LIPID PANEL
Cholesterol: 230 mg/dL — ABNORMAL HIGH (ref 0–200)
HDL: 55.2 mg/dL (ref >39.00)
LDL Cholesterol: 138 mg/dL — ABNORMAL HIGH (ref 0–99)
NonHDL: 174.71
Total CHOL/HDL Ratio: 4
Triglycerides: 186 mg/dL — ABNORMAL HIGH (ref 0.0–149.0)
VLDL: 37.2 mg/dL (ref 0.0–40.0)

## 2018-06-25 LAB — TSH: TSH: 0.11 u[IU]/mL — ABNORMAL LOW (ref 0.35–4.50)

## 2018-06-25 LAB — URIC ACID: Uric Acid, Serum: 6.6 mg/dL (ref 2.4–7.0)

## 2018-06-25 NOTE — Progress Notes (Signed)
PCP notes:   Health maintenance:  Colonoscopy - addressed Tetanus vaccine - postponed/insurance  Abnormal screenings:   Hearing -failed  Hearing Screening   125Hz  250Hz  500Hz  1000Hz  2000Hz  3000Hz  4000Hz  6000Hz  8000Hz   Right ear:   0 40 40  0    Left ear:   0 40 40  40     Patient concerns:   None  Nurse concerns:  None  Next PCP appt:   06/26/18 @ 1400

## 2018-06-25 NOTE — Progress Notes (Signed)
Subjective:   Gloria Lloyd is a 78 y.o. female who presents for Medicare Annual (Subsequent) preventive examination.  Review of Systems:  N/A Cardiac Risk Factors include: advanced age (>26men, >76 women);dyslipidemia;hypertension     Objective:     Vitals: BP 120/82 (BP Location: Right Arm, Patient Position: Sitting, Cuff Size: Normal)   Pulse (!) 58   Temp 98.6 F (37 C) (Oral)   Ht 5\' 6"  (1.676 m) Comment: shoes  Wt 143 lb (64.9 kg)   SpO2 97%   BMI 23.08 kg/m   Body mass index is 23.08 kg/m.  Advanced Directives 06/25/2018 06/12/2017 05/24/2016 05/10/2016  Does Patient Have a Medical Advance Directive? No No No No  Would patient like information on creating a medical advance directive? No - Patient declined No - Patient declined - No - Patient declined    Tobacco Social History   Tobacco Use  Smoking Status Former Smoker  Smokeless Tobacco Never Used     Counseling given: No   Clinical Intake:  Pre-visit preparation completed: Yes  Pain : No/denies pain Pain Score: 0-No pain     Nutritional Status: BMI of 19-24  Normal Nutritional Risks: None Diabetes: No  How often do you need to have someone help you when you read instructions, pamphlets, or other written materials from your doctor or pharmacy?: 5 - Always  Interpreter Needed?: No  Comments: pt lives with spouse Information entered by :: LPinson, LPN  Past Medical History:  Diagnosis Date  . Adenomatous polyp of colon 04/2005  . Dementia ([place])   . Diverticulosis   . Erosive esophagitis   . Gout   . Hiatal hernia   . Hyperlipidemia   . Hypertension   . Thyroid disease    Past Surgical History:  Procedure Laterality Date  . THYROIDECTOMY, PARTIAL    . TONSILLECTOMY     Family History  Problem Relation Age of Onset  . Diabetes Mother   . Hyperlipidemia Mother   . Hypertension Mother   . Hyperthyroidism Mother   . Coronary artery disease Maternal Grandmother    Social History    Socioeconomic History  . Marital status: Married    Spouse name: Not on file  . Number of children: 2  . Years of education: Not on file  . Highest education level: Not on file  Occupational History  . Occupation: retired    Fish farm manager: RETIRED  Social Needs  . Financial resource strain: Not on file  . Food insecurity:    Worry: Not on file    Inability: Not on file  . Transportation needs:    Medical: Not on file    Non-medical: Not on file  Tobacco Use  . Smoking status: Former Research scientist (life sciences)  . Smokeless tobacco: Never Used  Substance and Sexual Activity  . Alcohol use: Yes    Alcohol/week: 0.0 standard drinks    Comment: occasional  . Drug use: No  . Sexual activity: Not Currently  Lifestyle  . Physical activity:    Days per week: Not on file    Minutes per session: Not on file  . Stress: Not on file  Relationships  . Social connections:    Talks on phone: Not on file    Gets together: Not on file    Attends religious service: Not on file    Active member of club or organization: Not on file    Attends meetings of clubs or organizations: Not on file    Relationship status:  Not on file  Other Topics Concern  . Not on file  Social History Narrative   Pt lives in 2 story home with her husband   Has 2 adult children   Some college education   Last employment - [employer]                 Outpatient Encounter Medications as of 06/25/2018  Medication Sig  . amLODipine (NORVASC) 5 MG tablet TAKE 1 TABLET BY MOUTH EVERY DAY  . Cholecalciferol (VITAMIN D3 PO) Take 1 capsule by mouth daily.  Marland Kitchen donepezil (ARICEPT) 10 MG tablet TAKE 1 TABLET BY MOUTH AT BEDTIME  . levothyroxine (SYNTHROID, LEVOTHROID) 100 MCG tablet Take 1 tablet (100 mcg total) by mouth daily.   No facility-administered encounter medications on file as of 06/25/2018.     Activities of Daily Living In your present state of health, do you have any difficulty performing the following  activities: 06/25/2018  Hearing? N  Vision? N  Difficulty concentrating or making decisions? Y  Walking or climbing stairs? N  Dressing or bathing? N  Doing errands, shopping? Y  Preparing Food and eating ? N  Using the Toilet? N  In the past six months, have you accidently leaked urine? N  Do you have problems with loss of bowel control? N  Managing your Medications? Y  Managing your Finances? Y  Housekeeping or managing your Housekeeping? N  Some recent data might be hidden    Patient Care Team: Jinny Sanders, MD as PCP - General Katy Apo, MD as Consulting Physician (Ophthalmology)    Assessment:   This is a routine wellness examination for Gloria Lloyd.   Hearing Screening   125Hz  250Hz  500Hz  1000Hz  2000Hz  3000Hz  4000Hz  6000Hz  8000Hz   Right ear:   0 40 40  0    Left ear:   0 40 40  40    Vision Screening Comments: Vision exam in 2019   Exercise Activities and Dietary recommendations Current Exercise Habits: The patient does not participate in regular exercise at present, Exercise limited by: None identified  Goals    . DIET - INCREASE WATER INTAKE     Starting 06/25/2018, I will continue to drink 3-4 glasses of water daily along with other liquids to prevent dehydration.        Fall Risk Fall Risk  06/25/2018 02/19/2018 06/12/2017 05/24/2016 05/23/2015  Falls in the past year? 0 No No No No   Depression Screen PHQ 2/9 Scores 06/25/2018 06/12/2017 05/24/2016 05/23/2015  PHQ - 2 Score 0 0 0 0  PHQ- 9 Score 0 0 - -     Cognitive Function MMSE - Mini Mental State Exam 06/25/2018 02/19/2018 06/12/2017 05/24/2016  Not completed: Unable to complete - (No Data) (No Data)  Orientation to time - 0 - -  Orientation to Place - 0 - -  Registration - 3 - -  Attention/ Calculation - 0 - -  Recall - 0 - -  Language- name 2 objects - 2 - -  Language- repeat - 1 - -  Language- follow 3 step command - 0 - -  Language- read & follow direction - 1 - -  Write a sentence - 0 - -   Copy design - 0 - -  Total score - 7 - -    A Mini-Cog was not completed. Dx of Dementia    Immunization History  Administered Date(s) Administered  . Influenza Split 04/01/2012  . Influenza Whole 04/06/2007,  03/09/2008, 03/16/2009  . Influenza,inj,Quad PF,6+ Mos 02/19/2013, 03/29/2014, 03/16/2015, 03/19/2016, 03/26/2017, 05/14/2018  . Pneumococcal Conjugate-13 05/23/2015  . Pneumococcal Polysaccharide-23 06/11/2003, 06/07/2016  . Td 11/09/2006    Screening Tests Health Maintenance  Topic Date Due  . COLONOSCOPY  06/10/2019 (Originally 04/08/2018)  . TETANUS/TDAP  06/09/2020 (Originally 11/08/2016)  . INFLUENZA VACCINE  Completed  . DEXA SCAN  Completed  . PNA vac Low Risk Adult  Completed      Plan:     I have personally reviewed, addressed, and noted the following in the patient's chart:  A. Medical and social history B. Use of alcohol, tobacco or illicit drugs  C. Current medications and supplements D. Functional ability and status E.  Nutritional status F.  Physical activity G. Advance directives H. List of other physicians I.  Hospitalizations, surgeries, and ER visits in previous 12 months J.  [place] to include hearing, vision, cognitive, depression L. Referrals and appointments - none  In addition, I have reviewed and discussed with patient certain preventive protocols, quality metrics, and best practice recommendations. A written personalized care plan for preventive services as well as general preventive health recommendations were provided to patient.  See attached scanned questionnaire for additional information.   Signed,   Lindell Noe, MHA, BS, LPN Health Coach

## 2018-06-25 NOTE — Patient Instructions (Signed)
Gloria Lloyd , Thank you for taking time to come for your Medicare Wellness Visit. I appreciate your ongoing commitment to your health goals. Please review the following plan we discussed and let me know if I can assist you in the future.   These are the goals we discussed: Goals    . DIET - INCREASE WATER INTAKE     Starting 06/25/2018, I will continue to drink 3-4 glasses of water daily along with other liquids to prevent dehydration.        This is a list of the screening recommended for you and due dates:  Health Maintenance  Topic Date Due  . Colon Cancer Screening  06/10/2019*  . Tetanus Vaccine  06/09/2020*  . Flu Shot  Completed  . DEXA scan (bone density measurement)  Completed  . Pneumonia vaccines  Completed  *Topic was postponed. The date shown is not the original due date.   Preventive Care for Adults  A healthy lifestyle and preventive care can promote health and wellness. Preventive health guidelines for adults include the following key practices.  . A routine yearly physical is a good way to check with your health care provider about your health and preventive screening. It is a chance to share any concerns and updates on your health and to receive a thorough exam.  . Visit your dentist for a routine exam and preventive care every 6 months. Brush your teeth twice a day and floss once a day. Good oral hygiene prevents tooth decay and gum disease.  . The frequency of eye exams is based on your age, health, family medical history, use  of contact lenses, and other factors. Follow your health care provider's recommendations for frequency of eye exams.  . Eat a healthy diet. Foods like vegetables, fruits, whole grains, low-fat dairy products, and lean protein foods contain the nutrients you need without too many calories. Decrease your intake of foods high in solid fats, added sugars, and salt. Eat the right amount of calories for you. Get information about a proper diet from  your health care provider, if necessary.  . Regular physical exercise is one of the most important things you can do for your health. Most adults should get at least 150 minutes of moderate-intensity exercise (any activity that increases your heart rate and causes you to sweat) each week. In addition, most adults need muscle-strengthening exercises on 2 or more days a week.  Silver Sneakers may be a benefit available to you. To determine eligibility, you may visit the website: www.silversneakers.com or contact program at 3017135374 Mon-Fri between 8AM-8PM.   . Maintain a healthy weight. The body mass index (BMI) is a screening tool to identify possible weight problems. It provides an estimate of body fat based on height and weight. Your health care provider can find your BMI and can help you achieve or maintain a healthy weight.   For adults 20 years and older: ? A BMI below 18.5 is considered underweight. ? A BMI of 18.5 to 24.9 is normal. ? A BMI of 25 to 29.9 is considered overweight. ? A BMI of 30 and above is considered obese.   . Maintain normal blood lipids and cholesterol levels by exercising and minimizing your intake of saturated fat. Eat a balanced diet with plenty of fruit and vegetables. Blood tests for lipids and cholesterol should begin at age 54 and be repeated every 5 years. If your lipid or cholesterol levels are high, you are over 50, or  you are at high risk for heart disease, you may need your cholesterol levels checked more frequently. Ongoing high lipid and cholesterol levels should be treated with medicines if diet and exercise are not working.  . If you smoke, find out from your health care provider how to quit. If you do not use tobacco, please do not start.  . If you choose to drink alcohol, please do not consume more than 2 drinks per day. One drink is considered to be 12 ounces (355 mL) of beer, 5 ounces (148 mL) of wine, or 1.5 ounces (44 mL) of liquor.  . If you  are 76-15 years old, ask your health care provider if you should take aspirin to prevent strokes.  . Use sunscreen. Apply sunscreen liberally and repeatedly throughout the day. You should seek shade when your shadow is shorter than you. Protect yourself by wearing long sleeves, pants, a wide-brimmed hat, and sunglasses year round, whenever you are outdoors.  . Once a month, do a whole body skin exam, using a mirror to look at the skin on your back. Tell your health care provider of new moles, moles that have irregular borders, moles that are larger than a pencil eraser, or moles that have changed in shape or color.

## 2018-06-25 NOTE — Progress Notes (Signed)
I reviewed health advisor's note, was available for consultation, and agree with documentation and plan.   Signed,  Holden Maniscalco T. Kaile Bixler, MD  

## 2018-06-26 ENCOUNTER — Encounter: Payer: Medicare Other | Admitting: Family Medicine

## 2018-06-26 ENCOUNTER — Ambulatory Visit (INDEPENDENT_AMBULATORY_CARE_PROVIDER_SITE_OTHER): Payer: Medicare Other | Admitting: Family Medicine

## 2018-06-26 ENCOUNTER — Encounter: Payer: Self-pay | Admitting: Family Medicine

## 2018-06-26 VITALS — BP 110/80 | HR 99 | Temp 98.4°F | Ht 66.0 in | Wt 145.0 lb

## 2018-06-26 DIAGNOSIS — I10 Essential (primary) hypertension: Secondary | ICD-10-CM

## 2018-06-26 DIAGNOSIS — E039 Hypothyroidism, unspecified: Secondary | ICD-10-CM | POA: Diagnosis not present

## 2018-06-26 DIAGNOSIS — Z0000 Encounter for general adult medical examination without abnormal findings: Secondary | ICD-10-CM

## 2018-06-26 DIAGNOSIS — E785 Hyperlipidemia, unspecified: Secondary | ICD-10-CM

## 2018-06-26 DIAGNOSIS — F0390 Unspecified dementia without behavioral disturbance: Secondary | ICD-10-CM

## 2018-06-26 DIAGNOSIS — D751 Secondary polycythemia: Secondary | ICD-10-CM

## 2018-06-26 DIAGNOSIS — F03B0 Unspecified dementia, moderate, without behavioral disturbance, psychotic disturbance, mood disturbance, and anxiety: Secondary | ICD-10-CM

## 2018-06-26 DIAGNOSIS — M1A9XX0 Chronic gout, unspecified, without tophus (tophi): Secondary | ICD-10-CM

## 2018-06-26 NOTE — Progress Notes (Signed)
Subjective:    Patient ID: Gloria Lloyd, female    DOB: [birth year], 78 y.o.   MRN: [id number]  HPI The patient presents for  complete physical and review of chronic health problems. He/She also has the following acute concerns today:  The patient saw Candis Musa, LPN for medicare wellness. Note reviewed in detail and important notes copied below.  Health maintenance:  Colonoscopy - addressed Tetanus vaccine - postponed/insurance  Abnormal screenings:   Hearing -failed             Hearing Screening   125Hz  250Hz  500Hz  1000Hz  2000Hz  3000Hz  4000Hz  6000Hz  8000Hz   Right ear:   0 40 40  0    Left ear:   0 40 40  40     Patient concerns:   None  06/26/18 Here with family today. Daughter. Feeling well overall.  Dementia, moderate to severe:  On aricept  Followed by Dr. Delice Lesch, NEURO.  She has started doing some wandering at night. Has security system that would alert of wandering. Husband Dx with leukemia  At hospital got lost.  Hypertension:   Good control on amlodipine. Using medication without problems or lightheadedness: none Chest pain with exertion:none Edema:none Short of breath:none Average home BPs: Other issues:  Gout:  No flares.  Hypothyroid  Stable on levothyroxine. Lab Results  Component Value Date   TSH 0.11 (L) 06/25/2018    Social History /Family History/Past Medical History reviewed in detail and updated in EMR if needed. Blood pressure 110/80, pulse 99, temperature 98.4 F (36.9 C), temperature source Oral, height 5\' 6"  (1.676 m), weight 145 lb (65.8 kg).   Review of Systems  Constitutional: Negative for fatigue and fever.  HENT: Negative for congestion.   Eyes: Negative for pain.  Respiratory: Negative for cough and shortness of breath.   Cardiovascular: Negative for chest pain, palpitations and leg swelling.  Gastrointestinal: Negative for abdominal pain.  Genitourinary: Negative for dysuria and vaginal  bleeding.  Musculoskeletal: Negative for back pain.  Neurological: Negative for syncope, light-headedness and headaches.  Psychiatric/Behavioral: Negative for dysphoric mood.       Objective:   Physical Exam Constitutional:      General: She is not in acute distress.    Appearance: Normal appearance. She is well-developed. She is not ill-appearing or toxic-appearing.  HENT:     Head: Normocephalic.     Right Ear: Hearing, tympanic membrane, ear canal and external ear normal. Tympanic membrane is not erythematous, retracted or bulging.     Left Ear: Hearing, tympanic membrane, ear canal and external ear normal. Tympanic membrane is not erythematous, retracted or bulging.     Nose: Nose normal. No mucosal edema or rhinorrhea.     Right Sinus: No maxillary sinus tenderness or frontal sinus tenderness.     Left Sinus: No maxillary sinus tenderness or frontal sinus tenderness.     Mouth/Throat:     Pharynx: Uvula midline.  Eyes:     General: Lids are normal. Lids are everted, no foreign bodies appreciated.     Conjunctiva/sclera: Conjunctivae normal.     Pupils: Pupils are equal, round, and reactive to light.  Neck:     Musculoskeletal: Normal range of motion and neck supple.     Thyroid: No thyroid mass or thyromegaly.     Vascular: No carotid bruit.     Trachea: Trachea normal.  Cardiovascular:     Rate and Rhythm: Normal rate and regular rhythm.     Pulses: Normal pulses.  Heart sounds: Normal heart sounds, S1 normal and S2 normal. No murmur. No friction rub. No gallop.   Pulmonary:     Effort: Pulmonary effort is normal. No tachypnea or respiratory distress.     Breath sounds: Normal breath sounds. No decreased breath sounds, wheezing, rhonchi or rales.  Abdominal:     General: Bowel sounds are normal. There is no distension or abdominal bruit.     Palpations: Abdomen is soft. There is no fluid wave or mass.     Tenderness: There is no abdominal tenderness. There is no  guarding or rebound.     Hernia: No hernia is present.  Lymphadenopathy:     Cervical: No cervical adenopathy.  Skin:    General: Skin is warm and dry.     Findings: No rash.  Neurological:     Mental Status: She is alert. Mental status is at baseline. She is disoriented and confused.     Cranial Nerves: No cranial nerve deficit.     Sensory: No sensory deficit.     Motor: Motor function is intact.     Coordination: Coordination is intact.     Gait: Gait is intact.  Psychiatric:        Mood and Affect: Mood is not anxious or depressed.        Speech: Speech normal.        Behavior: Behavior normal. Behavior is cooperative.        Thought Content: Thought content normal.        Judgment: Judgment normal.           Assessment & Plan:  The patient's preventative maintenance and recommended screening tests for an annual wellness exam were reviewed in full today. Brought up to date unless services declined.  Counselled on the importance of diet, exercise, and its role in overall health and mortality. The patient's FH and SH was reviewed, including their home life, tobacco status, and drug and alcohol status.   Vaccines: consider tdapRefused shingles, Td Pap/DVE:Not indicated, No family history of uterine or ovarian cancer, asymptomatic.Not indicate DVE. Mammo:Not indicated Bone Density:04/2011 osteopenia in hip, nml in spine, plan repeat in 5 years.  Colon:2014 , polyps plan repeat in 5 years, although may stop given dementia. Dr.Stark. Smoking Status:None ETOH/ drug OIP:PGFQ/MKJI

## 2018-06-26 NOTE — Assessment & Plan Note (Signed)
Well controlled. Continue current medication.  

## 2018-06-26 NOTE — Assessment & Plan Note (Signed)
9/30 MMSE in 02/2018 per Dr. Delice Lesch.  Continue aricept.

## 2018-06-26 NOTE — Assessment & Plan Note (Signed)
Discussed low uric acid diet. No flares on no med.

## 2018-06-26 NOTE — Assessment & Plan Note (Signed)
Stable control. 

## 2018-06-26 NOTE — Assessment & Plan Note (Signed)
Will no longer be aggressive in treatment and eval of chol given dementia progression.

## 2018-06-30 ENCOUNTER — Telehealth: Payer: Self-pay | Admitting: Family Medicine

## 2018-06-30 ENCOUNTER — Encounter: Payer: Self-pay | Admitting: Family Medicine

## 2018-06-30 NOTE — Telephone Encounter (Signed)
error 

## 2018-07-06 ENCOUNTER — Other Ambulatory Visit: Payer: Self-pay | Admitting: Family Medicine

## 2018-08-03 DIAGNOSIS — H5212 Myopia, left eye: Secondary | ICD-10-CM | POA: Diagnosis not present

## 2018-08-03 DIAGNOSIS — H40053 Ocular hypertension, bilateral: Secondary | ICD-10-CM | POA: Diagnosis not present

## 2018-08-03 DIAGNOSIS — H2513 Age-related nuclear cataract, bilateral: Secondary | ICD-10-CM | POA: Diagnosis not present

## 2018-08-03 DIAGNOSIS — H52203 Unspecified astigmatism, bilateral: Secondary | ICD-10-CM | POA: Diagnosis not present

## 2018-08-23 ENCOUNTER — Other Ambulatory Visit: Payer: Self-pay | Admitting: Family Medicine

## 2018-08-31 ENCOUNTER — Telehealth: Payer: Self-pay | Admitting: *Deleted

## 2018-08-31 MED ORDER — LORAZEPAM 0.5 MG PO TABS: 0.5000 mg | ORAL_TABLET | Freq: Three times a day (TID) | ORAL | 1 refills | Status: DC | PRN

## 2018-08-31 NOTE — Telephone Encounter (Signed)
Rusty notified by telephone that Dr. Lorelei Pont has sent in a prescription for ativan to [facility] on [address].  I advised that if Dr. Diona Browner has any further recommendations for long term, I would call him back tomorrow.

## 2018-08-31 NOTE — Telephone Encounter (Signed)
Given urgency of this message, COVID-19 and Influenza outbreak, I think this is not unreasonable.   I would like to get Dr. Sharmaine Base input, since I do not know the patient.   Reasonable to give some Ativan 0.5 mg, 1 tab TID prn agitation  I would like to involve Dr. B for more longitudinal decisions regarding additional POC or treatments.
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
## 2018-09-01 NOTE — Telephone Encounter (Signed)
The ativan as needed is appropriate. Thi is fairly unusual for this patient I believe, so make sure to document.. no fever, no cough, upper respiratory symptoms, no  UTI symptoms etc.  Also ask.. any med changes?
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
## 2018-09-01 NOTE — Telephone Encounter (Signed)
Noted. Will given Ativan a few days and see if this helps.

## 2018-09-16 ENCOUNTER — Other Ambulatory Visit: Payer: Self-pay

## 2018-09-16 ENCOUNTER — Encounter: Payer: Self-pay | Admitting: Family Medicine

## 2018-09-16 ENCOUNTER — Ambulatory Visit (INDEPENDENT_AMBULATORY_CARE_PROVIDER_SITE_OTHER)
Admission: RE | Admit: 2018-09-16 | Discharge: 2018-09-16 | Disposition: A | Discharge status: Home / Self Care | Payer: Medicare Other | Source: Ambulatory Visit | Attending: Family Medicine | Admitting: Family Medicine

## 2018-09-16 ENCOUNTER — Ambulatory Visit (INDEPENDENT_AMBULATORY_CARE_PROVIDER_SITE_OTHER): Payer: Medicare Other | Admitting: Family Medicine

## 2018-09-16 VITALS — BP 110/80 | HR 113 | Temp 98.9°F | Ht 66.0 in | Wt 141.8 lb

## 2018-09-16 DIAGNOSIS — S3091XA Unspecified superficial injury of lower back and pelvis, initial encounter: Secondary | ICD-10-CM

## 2018-09-16 DIAGNOSIS — W19XXXA Unspecified fall, initial encounter: Secondary | ICD-10-CM

## 2018-09-16 DIAGNOSIS — S3993XA Unspecified injury of pelvis, initial encounter: Secondary | ICD-10-CM | POA: Diagnosis not present

## 2018-09-16 DIAGNOSIS — F0390 Unspecified dementia without behavioral disturbance: Secondary | ICD-10-CM

## 2018-09-16 DIAGNOSIS — M533 Sacrococcygeal disorders, not elsewhere classified: Secondary | ICD-10-CM

## 2018-09-16 DIAGNOSIS — F03B0 Unspecified dementia, moderate, without behavioral disturbance, psychotic disturbance, mood disturbance, and anxiety: Secondary | ICD-10-CM

## 2018-09-16 NOTE — Progress Notes (Signed)
Rainey Kahrs T. Derrek Puff, MD Primary Care and [place] at [facility] [place], [address] Phone: [phone]  FAX: [phone]  Gloria Lloyd - 78 y.o. female  MRN [id number]  Date of Birth: April 22, 1941  Visit Date: 09/16/2018  PCP: Jinny Sanders, MD  Referred by: Jinny Sanders, MD  Chief Complaint  Patient presents with  . Fall    Saturday  . Tailbone Pain   Subjective:   Gloria Lloyd is a 78 y.o. very pleasant female patient who presents with the following:  DOI 09/12/2018  [place] on the TV stand.  OK if still and does not have to move.  Walks will hurt.   Primary history is from the patient's daughter.  She fell on the above date of injury, and she struck her bottom of her sacrum or pelvis on a TV stand she has been having significant pain since then.  The patient has some significant dementia, and she is not able to give much of an accurate history, or even comment much on her pain.  Her daughter tells me that she has been in a lot of pain, pain with sitting, some pain with ambulation since this time.  Past Medical History, Surgical History, Social History, Family History, Problem List, Medications, and Allergies have been reviewed and updated if relevant.  Patient Active Problem List   Diagnosis Date Noted  . Advanced care planning/counseling discussion 05/23/2015  . Moderate dementia without behavioral disturbance ([place]) 08/30/2014  . Stopped smoking with greater than 40 pack year history 02/19/2013  . Screening examination for venereal disease 01/04/2013  . Osteopenia 03/08/2011  . VAGINITIS, ATROPHIC 04/13/2010  . GERD 03/29/2010  . PERSONAL HX COLONIC POLYPS 03/29/2010  . UNSPECIFIED ARTHROPATHY MULTIPLE SITES 05/31/2009  . Gout 12/20/2008  . POLYCYTHEMIA 08/25/2008  . TOBACCO USE, QUIT 08/15/2008  . HYPERCALCEMIA 07/01/2007  . HERPES LABIALIS 04/06/2007  . Hypothyroidism 04/06/2007  .  Hyperlipidemia 04/06/2007  . DISORDER, CALCIUM METABOLISM NOS 04/06/2007  . Essential hypertension, benign 04/06/2007    Past Medical History:  Diagnosis Date  . Adenomatous polyp of colon 04/2005  . Dementia ([place])   . Diverticulosis   . Erosive esophagitis   . Gout   . Hiatal hernia   . Hyperlipidemia   . Hypertension   . Thyroid disease     Past Surgical History:  Procedure Laterality Date  . THYROIDECTOMY, PARTIAL    . TONSILLECTOMY      Social History   Socioeconomic History  . Marital status: Married    Spouse name: Not on file  . Number of children: 2  . Years of education: Not on file  . Highest education level: Not on file  Occupational History  . Occupation: retired    Fish farm manager: RETIRED  Social Needs  . Financial resource strain: Not on file  . Food insecurity:    Worry: Not on file    Inability: Not on file  . Transportation needs:    Medical: Not on file    Non-medical: Not on file  Tobacco Use  . Smoking status: Former Research scientist (life sciences)  . Smokeless tobacco: Never Used  Substance and Sexual Activity  . Alcohol use: Yes    Alcohol/week: 0.0 standard drinks    Comment: occasional  . Drug use: No  . Sexual activity: Not Currently  Lifestyle  . Physical activity:    Days per week: Not on file    Minutes per session:  Not on file  . Stress: Not on file  Relationships  . Social connections:    Talks on phone: Not on file    Gets together: Not on file    Attends religious service: Not on file    Active member of club or organization: Not on file    Attends meetings of clubs or organizations: Not on file    Relationship status: Not on file  . Intimate partner violence:    Fear of current or ex partner: Not on file    Emotionally abused: Not on file    Physically abused: Not on file    Forced sexual activity: Not on file  Other Topics Concern  . Not on file  Social History Narrative   Pt lives in 2 story home with her husband   Has 2 adult children    Some college education   Last employment - [employer]                 Family History  Problem Relation Age of Onset  . Diabetes Mother   . Hyperlipidemia Mother   . Hypertension Mother   . Hyperthyroidism Mother   . Coronary artery disease Maternal Grandmother     Allergies  Allergen Reactions  . Penicillins     REACTION: As a child  . Sulfonamide Derivatives     REACTION: Rash, itch.    Medication list reviewed and updated in full in [place].  GEN: No fevers, chills. Nontoxic. Primarily MSK c/o today. MSK: Detailed in the HPI GI: tolerating PO intake without difficulty Neuro: No numbness, parasthesias, or tingling associated. Otherwise the pertinent positives of the ROS are noted above.   Objective:   BP 110/80   Pulse (!) 113   Temp 98.9 F (37.2 C) (Oral)   Ht 5\' 6"  (1.676 m)   Wt 141 lb 12 oz (64.3 kg)   BMI 22.88 kg/m    GEN: WDWN, NAD, Non-toxic, Alert & Oriented x 3 HEENT: Atraumatic, Normocephalic.  Ears and Nose: No external deformity. EXTR: No clubbing/cyanosis/edema NEURO: Normal gait.  PSYCH: Normally interactive. Conversant. Not depressed or anxious appearing.  Calm demeanor.    No tenderness along spinous processes from the thoracic to lumbar spine.  Entirety of the sacrum is nontender.  She is acutely tender at her coccyx. This portion of the physical examination was chaperoned by Hedy Camara, CMA.   Radiology: Dg Sacrum/coccyx  Result Date: 09/16/2018 CLINICAL DATA:  Recent fall with possible coccygeal fracture EXAM: SACRUM AND COCCYX - 2+ VIEW COMPARISON:  08/08/2016 FINDINGS: Degenerative changes of lumbar spine are seen. The sacral ala are within normal limits. Pelvic ring is intact. No sacral or coccygeal fracture is noted. IMPRESSION: No evidence of acute fracture noted. Electronically Signed   By: Inez Catalina M.D.   On: 09/16/2018 15:50     Assessment and Plan:   Coccyx pain - Plan: DG Sacrum/Coccyx  Fall,  initial encounter - Plan: DG Sacrum/Coccyx  Moderate dementia without behavioral disturbance (HCC)  >25 minutes spent in face to face time with patient, >50% spent in counselling or coordination of care   Sacrum and coccyx films are independently reviewed by myself, and there is no apparent fracture or dislocation.  Occult fracture of the coccyx cannot be excluded, and bone contusion is certainly there even if there is occult fracture.  None of this changes management.  She will certainly be in pain for at least 4 weeks.  Possibly  longer given age.  Additional time spent in discussion and management face-to-face given the patient's dementia, difficulty with movement, repetitive explanation.  The patient's daughter asked about some behavioral changes that is noted in the chart and with some discussion with the patient's primary care doctor.  They asked about some long-term medication, and I do not think that it is appropriate for me to initiate that.  Follow-up: No follow-ups on file.  Orders Placed This Encounter  Procedures  . DG Sacrum/Coccyx    Signed,  Maud Deed. Maysen Sudol, MD   Outpatient Encounter Medications as of 09/16/2018  Medication Sig  . amLODipine (NORVASC) 5 MG tablet TAKE 1 TABLET BY MOUTH EVERY DAY  . Cholecalciferol (VITAMIN D3 PO) Take 1 capsule by mouth daily.  Marland Kitchen donepezil (ARICEPT) 10 MG tablet TAKE 1 TABLET BY MOUTH AT BEDTIME  . levothyroxine (SYNTHROID, LEVOTHROID) 100 MCG tablet TAKE 1 TABLET(100 MCG) BY MOUTH DAILY  . LORazepam (ATIVAN) 0.5 MG tablet Take 1 tablet (0.5 mg total) by mouth every 8 (eight) hours as needed for anxiety.   No facility-administered encounter medications on file as of 09/16/2018.

## 2018-09-17 ENCOUNTER — Encounter: Payer: Self-pay | Admitting: Family Medicine

## 2018-09-18 ENCOUNTER — Ambulatory Visit: Payer: Medicare Other | Admitting: Neurology

## 2018-10-09 ENCOUNTER — Other Ambulatory Visit: Payer: Self-pay | Admitting: Family Medicine

## 2018-10-09 NOTE — Telephone Encounter (Signed)
Last office visit 09/16/2018 with Dr. Lorelei Pont for coccyx pain.  Last refilled 08/31/2018 for #30 with 1 refill by Dr. Lorelei Pont.  Next appt: 12/25/2018 with Dr. Diona Browner to follow up on her alzheimer's.

## 2018-12-22 ENCOUNTER — Other Ambulatory Visit: Payer: Self-pay | Admitting: Family Medicine

## 2018-12-23 NOTE — Telephone Encounter (Signed)
Electronic refill request Lorazepam Last reflll 10/09/18 #30/1 Last office visit 09/16/18 acute Upcoming appointment 12/25/18

## 2018-12-25 ENCOUNTER — Other Ambulatory Visit: Payer: Self-pay

## 2018-12-25 ENCOUNTER — Ambulatory Visit (INDEPENDENT_AMBULATORY_CARE_PROVIDER_SITE_OTHER): Payer: Medicare Other | Admitting: Family Medicine

## 2018-12-25 VITALS — BP 112/70 | HR 73 | Temp 98.7°F | Wt 135.8 lb

## 2018-12-25 DIAGNOSIS — R410 Disorientation, unspecified: Secondary | ICD-10-CM

## 2018-12-25 DIAGNOSIS — R451 Restlessness and agitation: Secondary | ICD-10-CM

## 2018-12-25 DIAGNOSIS — F0390 Unspecified dementia without behavioral disturbance: Secondary | ICD-10-CM

## 2018-12-25 DIAGNOSIS — F03C0 Unspecified dementia, severe, without behavioral disturbance, psychotic disturbance, mood disturbance, and anxiety: Secondary | ICD-10-CM

## 2018-12-25 LAB — POCT URINALYSIS DIPSTICK
Bilirubin, UA: NEGATIVE
Blood, UA: NEGATIVE
Glucose, UA: NEGATIVE
Ketones, UA: NEGATIVE
Nitrite, UA: NEGATIVE
Protein, UA: POSITIVE — AB
Spec Grav, UA: 1.025 (ref 1.010–1.025)
Urobilinogen, UA: 0.2 E.U./dL
pH, UA: 5 (ref 5.0–8.0)

## 2018-12-25 MED ORDER — CITALOPRAM HYDROBROMIDE 10 MG PO TABS: 10.0000 mg | ORAL_TABLET | Freq: Every day | ORAL | 5 refills | Status: DC

## 2018-12-25 NOTE — Patient Instructions (Addendum)
Increase portion size and add meal supplement.  Start citalopram at night daily.  Use lorazepam twice daily prn.

## 2018-12-25 NOTE — Progress Notes (Signed)
Chief Complaint  Patient presents with  . Follow-up    Patient is here today to F/U with Alzheimer's.  She currently takes Donepezil 1qhs. Daughter talks about how the Lorazepam is ineffective. It used to make her calm down but now she gets angry and it doesn't help with that any longer.    History of Present Illness: HPI    78 year old female presents for follow up alzheimer's dementia  Dementia, progressive with behavioral issues.. continued issues despite donezepil 10 mg daily SE to namenda in past.  Lorazepam no longer helps with agitation. Frantically,  activity,trying to do things at night... this seems to be occurring more often lately. Falls asleep at night, no wandering lately, sleeps 8 hours a night. Frequent rage episodes.Marland Kitchen out of the blue.   Husband may not even give it to her.   She has lost weight... eats three meals a day.. but small portions.   Daughter now living at her house since 08/2018 [facility] around the block with her.  Wt Readings from Last 3 Encounters:  09/16/18 141 lb 12 oz (64.3 kg)  06/26/18 145 lb (65.8 kg)  06/25/18 143 lb (64.9 kg)     Urine appears darker in last few weeks. No   Minimal water intake.  COVID 19 screen No recent travel or known exposure to COVID19 The patient denies respiratory symptoms of COVID 19 at this time.  The importance of social distancing was discussed today.   Review of Systems  Constitutional: Negative for chills and fever.  HENT: Negative for congestion and ear pain.   Eyes: Negative for pain and redness.  Respiratory: Negative for cough and shortness of breath.   Cardiovascular: Negative for chest pain, palpitations and leg swelling.  Gastrointestinal: Negative for abdominal pain, blood in stool, constipation, diarrhea, nausea and vomiting.  Genitourinary: Negative for dysuria.  Musculoskeletal: Negative for falls and myalgias.  Skin: Negative for rash.  Neurological: Negative for dizziness.   Psychiatric/Behavioral: Negative for depression. The patient is not nervous/anxious.       Past Medical History:  Diagnosis Date  . Adenomatous polyp of colon 04/2005  . Dementia ([place])   . Diverticulosis   . Erosive esophagitis   . Gout   . Hiatal hernia   . Hyperlipidemia   . Hypertension   . Thyroid disease     reports that she has quit smoking. She has never used smokeless tobacco. She reports current alcohol use. She reports that she does not use drugs.   Current Outpatient Medications:  .  amLODipine (NORVASC) 5 MG tablet, TAKE 1 TABLET BY MOUTH EVERY DAY, Disp: 90 tablet, Rfl: 3 .  Cholecalciferol (VITAMIN D3 PO), Take 1 capsule by mouth daily., Disp: , Rfl:  .  donepezil (ARICEPT) 10 MG tablet, TAKE 1 TABLET BY MOUTH AT BEDTIME, Disp: 90 tablet, Rfl: 3 .  levothyroxine (SYNTHROID, LEVOTHROID) 100 MCG tablet, TAKE 1 TABLET(100 MCG) BY MOUTH DAILY, Disp: 90 tablet, Rfl: 3 .  LORazepam (ATIVAN) 0.5 MG tablet, TAKE 1 TABLET(0.5 MG) BY MOUTH EVERY 8 HOURS AS NEEDED FOR ANXIETY, Disp: 30 tablet, Rfl: 0   Observations/Objective:   There were no vitals taken for this visit.  Physical Exam Constitutional:      General: She is not in acute distress.    Appearance: Normal appearance. She is well-developed. She is not ill-appearing or toxic-appearing.  HENT:     Head: Normocephalic.     Right Ear: Hearing, tympanic membrane, ear canal and external ear  normal. Tympanic membrane is not erythematous, retracted or bulging.     Left Ear: Hearing, tympanic membrane, ear canal and external ear normal. Tympanic membrane is not erythematous, retracted or bulging.     Nose: No mucosal edema or rhinorrhea.     Right Sinus: No maxillary sinus tenderness or frontal sinus tenderness.     Left Sinus: No maxillary sinus tenderness or frontal sinus tenderness.     Mouth/Throat:     Pharynx: Uvula midline.  Eyes:     General: Lids are normal. Lids are everted, no foreign bodies appreciated.      Conjunctiva/sclera: Conjunctivae normal.     Pupils: Pupils are equal, round, and reactive to light.  Neck:     Musculoskeletal: Normal range of motion and neck supple.     Thyroid: No thyroid mass or thyromegaly.     Vascular: No carotid bruit.     Trachea: Trachea normal.  Cardiovascular:     Rate and Rhythm: Normal rate and regular rhythm.     Pulses: Normal pulses.     Heart sounds: Normal heart sounds, S1 normal and S2 normal. No murmur. No friction rub. No gallop.   Pulmonary:     Effort: Pulmonary effort is normal. No tachypnea or respiratory distress.     Breath sounds: Normal breath sounds. No decreased breath sounds, wheezing, rhonchi or rales.  Abdominal:     General: Bowel sounds are normal.     Palpations: Abdomen is soft.     Tenderness: There is no abdominal tenderness.  Skin:    General: Skin is warm and dry.     Findings: No rash.  Neurological:     Mental Status: She is alert. She is disoriented and confused.  Psychiatric:        Mood and Affect: Mood is not anxious or depressed.        Speech: Speech normal.        Behavior: Behavior normal. Behavior is cooperative.        Thought Content: Thought content normal.        Judgment: Judgment normal.      Assessment and Plan   Severe dementia ([place])  Worsening agitation.. will start citalopram 10 mg  To deacrase agitation.  Can use lorazepam prn.  If continued progression we can consider increased aricept to 23 mg.   Encouraged meals supplement to stop weight loss.  Agitation Trial of citalopram. Lorazepam not very effective.  Gave behavioral methods to review.     Eliezer Lofts, MD

## 2018-12-25 NOTE — Assessment & Plan Note (Signed)
Worsening agitation.. will start citalopram 10 mg  To deacrase agitation.  Can use lorazepam prn.  If continued progression we can consider increased aricept to 23 mg.   Encouraged meals supplement to stop weight loss.

## 2018-12-25 NOTE — Assessment & Plan Note (Signed)
Trial of citalopram. Lorazepam not very effective.  Gave behavioral methods to review.

## 2019-01-25 ENCOUNTER — Encounter: Payer: Self-pay | Admitting: Family Medicine

## 2019-02-01 ENCOUNTER — Other Ambulatory Visit: Payer: Self-pay | Admitting: Family Medicine

## 2019-02-01 NOTE — Telephone Encounter (Signed)
Last office visit 12/25/2018 for sever dementia.  Last refilled 12/24/2018 for #30 with no refills.  CPE scheduled for 06/29/2019.

## 2019-02-24 ENCOUNTER — Other Ambulatory Visit: Payer: Self-pay | Admitting: *Deleted

## 2019-02-24 NOTE — Telephone Encounter (Signed)
Last office visit 12/25/2018 for dementia.  Last refilled 02/01/2019 for #30 with no refills.  CPE scheduled for 06/29/2019.

## 2019-02-25 MED ORDER — LORAZEPAM 0.5 MG PO TABS: ORAL_TABLET | ORAL | 0 refills | Status: DC

## 2019-03-30 ENCOUNTER — Ambulatory Visit (INDEPENDENT_AMBULATORY_CARE_PROVIDER_SITE_OTHER): Payer: Medicare Other

## 2019-03-30 DIAGNOSIS — Z23 Encounter for immunization: Secondary | ICD-10-CM | POA: Diagnosis not present

## 2019-04-07 ENCOUNTER — Other Ambulatory Visit: Payer: Self-pay | Admitting: Family Medicine

## 2019-04-07 NOTE — Telephone Encounter (Signed)
Last office visit 12/25/2018 for severe dementia.  Last refilled 02/25/2019 for #30 with no refills.  Next Appt: 04/08/2019 to discuss medication.

## 2019-04-08 ENCOUNTER — Other Ambulatory Visit: Payer: Self-pay

## 2019-04-08 ENCOUNTER — Ambulatory Visit (INDEPENDENT_AMBULATORY_CARE_PROVIDER_SITE_OTHER): Payer: Medicare Other | Admitting: Family Medicine

## 2019-04-08 DIAGNOSIS — F0390 Unspecified dementia without behavioral disturbance: Secondary | ICD-10-CM

## 2019-04-08 DIAGNOSIS — F03C0 Unspecified dementia, severe, without behavioral disturbance, psychotic disturbance, mood disturbance, and anxiety: Secondary | ICD-10-CM

## 2019-04-08 MED ORDER — CITALOPRAM HYDROBROMIDE 20 MG PO TABS: 20.0000 mg | ORAL_TABLET | Freq: Every day | ORAL | 5 refills | Status: DC

## 2019-04-08 NOTE — Progress Notes (Signed)
Chief Complaint  Patient presents with  . Dementia    follow up on medication     History of Present Illness: HPI    78 year old female presents for follow up dementia.  At last OV in 12/2018 She was having difficulty with agitation, rages  Started on citalopram low dose. And to use lorazepam prn.  Per daughter she has had some improvement in mood. She still has good and bad days. Mood spells of anger are very irratic... sensitive to noise.  No SE.    Wt Readings from Last 3 Encounters:  04/08/19 131 lb 8 oz (59.6 kg)  12/25/18 135 lb 12.8 oz (61.6 kg)  09/16/18 141 lb 12 oz (64.3 kg)      COVID 19 screen No recent travel or known exposure to COVID19 The patient denies respiratory symptoms of COVID 19 at this time.  The importance of social distancing was discussed today.   Review of Systems  Constitutional: Positive for malaise/fatigue and weight loss. Negative for chills and fever.  HENT: Negative for congestion and ear pain.   Eyes: Negative for pain and redness.  Respiratory: Negative for cough and shortness of breath.   Cardiovascular: Negative for chest pain, palpitations and leg swelling.  Gastrointestinal: Negative for abdominal pain, blood in stool, constipation, diarrhea, nausea and vomiting.  Genitourinary: Negative for dysuria.  Musculoskeletal: Negative for falls and myalgias.  Skin: Negative for rash.  Neurological: Negative for dizziness.  Psychiatric/Behavioral: Positive for memory loss. Negative for depression, hallucinations, substance abuse and suicidal ideas. The patient has insomnia. The patient is not nervous/anxious.       Past Medical History:  Diagnosis Date  . Adenomatous polyp of colon 04/2005  . Dementia ([place])   . Diverticulosis   . Erosive esophagitis   . Gout   . Hiatal hernia   . Hyperlipidemia   . Hypertension   . Thyroid disease     reports that she has quit smoking. She has never used smokeless tobacco. She reports current  alcohol use. She reports that she does not use drugs.   Current Outpatient Medications:  .  amLODipine (NORVASC) 5 MG tablet, TAKE 1 TABLET BY MOUTH EVERY DAY, Disp: 90 tablet, Rfl: 3 .  Cholecalciferol (VITAMIN D3 PO), Take 1 capsule by mouth daily., Disp: , Rfl:  .  citalopram (CELEXA) 10 MG tablet, Take 1 tablet (10 mg total) by mouth at bedtime., Disp: 30 tablet, Rfl: 5 .  donepezil (ARICEPT) 10 MG tablet, TAKE 1 TABLET BY MOUTH AT BEDTIME, Disp: 90 tablet, Rfl: 3 .  levothyroxine (SYNTHROID, LEVOTHROID) 100 MCG tablet, TAKE 1 TABLET(100 MCG) BY MOUTH DAILY, Disp: 90 tablet, Rfl: 3 .  LORazepam (ATIVAN) 0.5 MG tablet, TAKE 1 TABLET(0.5 MG) BY MOUTH EVERY 8 HOURS AS NEEDED FOR ANXIETY, Disp: 30 tablet, Rfl: 0   Observations/Objective: Blood pressure 118/78, pulse 60, temperature 98.4 F (36.9 C), temperature source Temporal, height 5\' 6"  (1.676 m), weight 131 lb 8 oz (59.6 kg), SpO2 93 %.  Physical Exam Constitutional:      General: She is not in acute distress.    Appearance: Normal appearance. She is well-developed. She is not ill-appearing or toxic-appearing.  HENT:     Head: Normocephalic.     Right Ear: Hearing, tympanic membrane, ear canal and external ear normal. Tympanic membrane is not erythematous, retracted or bulging.     Left Ear: Hearing, tympanic membrane, ear canal and external ear normal. Tympanic membrane is not erythematous, retracted or  bulging.     Nose: No mucosal edema or rhinorrhea.     Right Sinus: No maxillary sinus tenderness or frontal sinus tenderness.     Left Sinus: No maxillary sinus tenderness or frontal sinus tenderness.     Mouth/Throat:     Pharynx: Uvula midline.  Eyes:     General: Lids are normal. Lids are everted, no foreign bodies appreciated.     Conjunctiva/sclera: Conjunctivae normal.     Pupils: Pupils are equal, round, and reactive to light.  Neck:     Musculoskeletal: Normal range of motion and neck supple.     Thyroid: No thyroid  mass or thyromegaly.     Vascular: No carotid bruit.     Trachea: Trachea normal.  Cardiovascular:     Rate and Rhythm: Normal rate and regular rhythm.     Pulses: Normal pulses.     Heart sounds: Normal heart sounds, S1 normal and S2 normal. No murmur. No friction rub. No gallop.   Pulmonary:     Effort: Pulmonary effort is normal. No tachypnea or respiratory distress.     Breath sounds: Normal breath sounds. No decreased breath sounds, wheezing, rhonchi or rales.  Abdominal:     General: Bowel sounds are normal.     Palpations: Abdomen is soft.     Tenderness: There is no abdominal tenderness.  Skin:    General: Skin is warm and dry.     Findings: No rash.  Neurological:     Mental Status: She is alert. She is disoriented and confused.  Psychiatric:        Mood and Affect: Mood is not anxious or depressed.        Speech: Speech normal.        Behavior: Behavior normal. Behavior is cooperative.        Thought Content: Thought content normal.        Judgment: Judgment normal.      Assessment and Plan  Severe dementia ([place]) Trail of increase in citalopram to decrease raging , irritability and agitation. Increase citalopram  To 20 mg daily.  Can use lorazepam for agitation twice daily.    Eliezer Lofts, MD

## 2019-04-08 NOTE — Patient Instructions (Signed)
Increase citalopram  To 20 mg daily.  Can use lorazepam for agitation twice daily.

## 2019-04-09 ENCOUNTER — Telehealth: Payer: Self-pay

## 2019-04-09 NOTE — Telephone Encounter (Signed)
Agree with plan 

## 2019-04-09 NOTE — Telephone Encounter (Signed)
Gloria Lloyd pts daughter said that she had just come in with 3 bags that she laid on the table and Gloria Lloyd went out of room very briefly and when came back pt had just put at least 1 pill in her mouth. Tracy counted the lorazepam 0.5 mg and there are 4 tabs gone;Tracy cannot find any extra pills on floor or anywhere. Gloria Lloyd thinks pt took four lorazepam 0.5 mg. Dr Diona Browner pt will be sleepy but give Gloria Lloyd the poison control # to call for suggestions and protocol. Gloria Lloyd voiced understanding and will call poison control now. Also advised pt could be very sleepy and may have some lightheadedness if tries to get up and move around or go to restroom. Gloria Lloyd will monitor pts actions. Gloria Lloyd will also be more careful about where she leaves medication. ED precautions given and Gloria Lloyd voiced understanding. FYI to Dr Diona Browner.

## 2019-05-03 ENCOUNTER — Encounter: Payer: Self-pay | Admitting: Family Medicine

## 2019-05-03 NOTE — Assessment & Plan Note (Signed)
Trail of increase in citalopram to decrease raging , irritability and agitation. Increase citalopram  To 20 mg daily.  Can use lorazepam for agitation twice daily.

## 2019-05-10 ENCOUNTER — Other Ambulatory Visit: Payer: Self-pay | Admitting: Family Medicine

## 2019-05-10 NOTE — Telephone Encounter (Signed)
Last office visit 04/08/2019 for Dementia.  Last refilled 04/08/2019 for #30 with no refills.  CPE scheduled for 06/29/2019.

## 2019-06-14 ENCOUNTER — Other Ambulatory Visit: Payer: Self-pay | Admitting: Family Medicine

## 2019-06-14 NOTE — Telephone Encounter (Signed)
Last office visit 04/08/2019 for dementia.  Last refilled 05/11/2019 for #30 with no refills.

## 2019-06-28 ENCOUNTER — Telehealth: Payer: Self-pay | Admitting: Family Medicine

## 2019-06-28 ENCOUNTER — Other Ambulatory Visit (INDEPENDENT_AMBULATORY_CARE_PROVIDER_SITE_OTHER): Payer: Medicare Other

## 2019-06-28 DIAGNOSIS — I10 Essential (primary) hypertension: Secondary | ICD-10-CM

## 2019-06-28 DIAGNOSIS — M1000 Idiopathic gout, unspecified site: Secondary | ICD-10-CM

## 2019-06-28 DIAGNOSIS — Z1321 Encounter for screening for nutritional disorder: Secondary | ICD-10-CM

## 2019-06-28 DIAGNOSIS — E039 Hypothyroidism, unspecified: Secondary | ICD-10-CM

## 2019-06-28 NOTE — Telephone Encounter (Signed)
-----   Message from Ellamae Sia sent at 06/22/2019  2:52 PM EST ----- Regarding: Lab orders for Monday, 1.18.21 Patient is scheduled for CPX labs, please order future labs, Thanks , Karna Christmas

## 2019-06-29 ENCOUNTER — Encounter: Payer: Self-pay | Admitting: Family Medicine

## 2019-06-29 ENCOUNTER — Ambulatory Visit: Payer: Medicare Other

## 2019-06-29 ENCOUNTER — Other Ambulatory Visit: Payer: Self-pay

## 2019-06-29 ENCOUNTER — Other Ambulatory Visit: Payer: Medicare Other

## 2019-06-29 ENCOUNTER — Encounter: Payer: Medicare Other | Admitting: Family Medicine

## 2019-06-29 ENCOUNTER — Ambulatory Visit (INDEPENDENT_AMBULATORY_CARE_PROVIDER_SITE_OTHER): Payer: Medicare Other | Admitting: Family Medicine

## 2019-06-29 VITALS — BP 106/70 | HR 61 | Temp 98.5°F | Ht 65.5 in | Wt 134.5 lb

## 2019-06-29 DIAGNOSIS — E039 Hypothyroidism, unspecified: Secondary | ICD-10-CM | POA: Diagnosis not present

## 2019-06-29 DIAGNOSIS — Z0000 Encounter for general adult medical examination without abnormal findings: Secondary | ICD-10-CM

## 2019-06-29 DIAGNOSIS — F03C0 Unspecified dementia, severe, without behavioral disturbance, psychotic disturbance, mood disturbance, and anxiety: Secondary | ICD-10-CM

## 2019-06-29 DIAGNOSIS — I10 Essential (primary) hypertension: Secondary | ICD-10-CM

## 2019-06-29 DIAGNOSIS — F0390 Unspecified dementia without behavioral disturbance: Secondary | ICD-10-CM

## 2019-06-29 LAB — COMPREHENSIVE METABOLIC PANEL
ALT: 10 U/L (ref 0–35)
AST: 15 U/L (ref 0–37)
Albumin: 4.3 g/dL (ref 3.5–5.2)
Alkaline Phosphatase: 59 U/L (ref 39–117)
BUN: 12 mg/dL (ref 6–23)
CO2: 35 mEq/L — ABNORMAL HIGH (ref 19–32)
Calcium: 10.4 mg/dL (ref 8.4–10.5)
Chloride: 99 mEq/L (ref 96–112)
Creatinine, Ser: 0.74 mg/dL (ref 0.40–1.20)
GFR: 75.55 mL/min (ref >60.00)
Glucose, Bld: 84 mg/dL (ref 70–99)
Potassium: 3.5 mEq/L (ref 3.5–5.1)
Sodium: 143 mEq/L (ref 135–145)
Total Bilirubin: 0.9 mg/dL (ref 0.2–1.2)
Total Protein: 7.4 g/dL (ref 6.0–8.3)

## 2019-06-29 LAB — URIC ACID: Uric Acid, Serum: 5.2 mg/dL (ref 2.4–7.0)

## 2019-06-29 LAB — T4, FREE: Free T4: 1.43 ng/dL (ref 0.60–1.60)

## 2019-06-29 LAB — TSH: TSH: 0.06 u[IU]/mL — ABNORMAL LOW (ref 0.35–4.50)

## 2019-06-29 LAB — VITAMIN D 25 HYDROXY (VIT D DEFICIENCY, FRACTURES): VITD: 46.04 ng/mL (ref 30.00–100.00)

## 2019-06-29 LAB — T3, FREE: T3, Free: 3 pg/mL (ref 2.3–4.2)

## 2019-06-29 NOTE — Progress Notes (Signed)
Chief Complaint  Patient presents with  . Medicare Wellness    History of Present Illness: HPI  The patient presents for annual medicare wellness, complete physical and review of chronic health problems. He/She also has the following acute concerns today: none   She presents with her daughter given her severe dementia.  Dementia .Marland Kitchen on Aricept. Restlessness and agitation treated with celexa and alprazolam... daughter reports her memory is gradaully worsening still.  She is  aggressive with her husband. Off and on.. good days and bad.  I have personally reviewed the Medicare Annual Wellness questionnaire and have noted 1. The patient's medical and social history 2. Their use of alcohol, tobacco or illicit drugs 3. Their current medications and supplements 4. The patient's functional ability including ADL's, fall risks, home safety risks and hearing or visual             impairment. 5. Diet and physical activities 6. Evidence for depression or mood disorders 7.         Updated provider list Cognitive evaluation was performed and recorded on pt medicare questionnaire form. The patients weight, height, BMI and visual acuity have been recorded in the chart  I have made referrals, counseling and provided education to the patient based review of the above and I have provided the pt with a written personalized care plan for preventive services.   Documentation of this information was scanned into the electronic record under the media tab.   Hearing Screening   125Hz  250Hz  500Hz  1000Hz  2000Hz  3000Hz  4000Hz  6000Hz  8000Hz   Right ear:           Left ear:           Vision Screening Comments: Yearly Eye Exam scheduled with Dr. Prudencio Burly 07/2019.  Fall Risk  06/29/2019 12/25/2018 06/25/2018 02/19/2018 06/12/2017  Falls in the past year? 0 0 0 No No  Follow up - Falls evaluation completed - - -      Office Visit from 06/29/2019 in [place] at [facility] Total Score  0      Occ  complaining about knees.   Hypertension:  BP Readings from Last 3 Encounters:  06/29/19 106/70  04/08/19 118/78  12/25/18 112/70       Wt Readings from Last 3 Encounters:  06/29/19 134 lb 8 oz (61 kg)  04/08/19 131 lb 8 oz (59.6 kg)  12/25/18 135 lb 12.8 oz (61.6 kg)      COVID 19 screen No recent travel or known exposure to COVID19 The patient denies respiratory symptoms of COVID 19 at this time.  The importance of social distancing was discussed today.   Review of Systems  Constitutional: Positive for malaise/fatigue and weight loss. Negative for chills and fever.  HENT: Negative for congestion and ear pain.   Eyes: Negative for pain and redness.  Respiratory: Negative for cough and shortness of breath.   Cardiovascular: Negative for chest pain, palpitations and leg swelling.  Gastrointestinal: Negative for abdominal pain, blood in stool, constipation, diarrhea, nausea and vomiting.  Genitourinary: Negative for dysuria.  Musculoskeletal: Negative for falls and myalgias.  Skin: Negative for rash.  Neurological: Negative for dizziness.  Psychiatric/Behavioral: Positive for memory loss. Negative for depression, hallucinations, substance abuse and suicidal ideas. The patient has insomnia. The patient is not nervous/anxious.       Past Medical History:  Diagnosis Date  . Adenomatous polyp of colon 04/2005  . Dementia ([place])   . Diverticulosis   . Erosive esophagitis   .  Gout   . Hiatal hernia   . Hyperlipidemia   . Hypertension   . Thyroid disease     reports that she has quit smoking. She has never used smokeless tobacco. She reports current alcohol use. She reports that she does not use drugs.   Current Outpatient Medications:  .  amLODipine (NORVASC) 5 MG tablet, TAKE 1 TABLET BY MOUTH EVERY DAY, Disp: 90 tablet, Rfl: 3 .  Cholecalciferol (VITAMIN D3 PO), Take 1 capsule by mouth daily., Disp: , Rfl:  .  donepezil (ARICEPT) 10 MG tablet, TAKE 1 TABLET BY MOUTH AT  BEDTIME, Disp: 90 tablet, Rfl: 3 .  levothyroxine (SYNTHROID) 100 MCG tablet, TAKE 1 TABLET(100 MCG) BY MOUTH DAILY, Disp: 90 tablet, Rfl: 0 .  citalopram (CELEXA) 20 MG tablet, Take 1 tablet (20 mg total) by mouth at bedtime., Disp: 90 tablet, Rfl: 1 .  LORazepam (ATIVAN) 0.5 MG tablet, Take 1 tablet by mouth every 8 hours as needed for anxiety, Disp: 30 tablet, Rfl: 0   Observations/Objective: Blood pressure 118/78, pulse 60, temperature 98.4 F (36.9 C), temperature source Temporal, height 5\' 6"  (1.676 m), weight 131 lb 8 oz (59.6 kg), SpO2 93 %.  Physical Exam Constitutional:      General: She is not in acute distress.    Appearance: Normal appearance. She is well-developed. She is not ill-appearing or toxic-appearing.  HENT:     Head: Normocephalic.     Right Ear: Hearing, tympanic membrane, ear canal and external ear normal. Tympanic membrane is not erythematous, retracted or bulging.     Left Ear: Hearing, tympanic membrane, ear canal and external ear normal. Tympanic membrane is not erythematous, retracted or bulging.     Nose: No mucosal edema or rhinorrhea.     Right Sinus: No maxillary sinus tenderness or frontal sinus tenderness.     Left Sinus: No maxillary sinus tenderness or frontal sinus tenderness.     Mouth/Throat:     Pharynx: Uvula midline.  Eyes:     General: Lids are normal. Lids are everted, no foreign bodies appreciated.     Conjunctiva/sclera: Conjunctivae normal.     Pupils: Pupils are equal, round, and reactive to light.  Neck:     Musculoskeletal: Normal range of motion and neck supple.     Thyroid: No thyroid mass or thyromegaly.     Vascular: No carotid bruit.     Trachea: Trachea normal.  Cardiovascular:     Rate and Rhythm: Normal rate and regular rhythm.     Pulses: Normal pulses.     Heart sounds: Normal heart sounds, S1 normal and S2 normal. No murmur. No friction rub. No gallop.   Pulmonary:     Effort: Pulmonary effort is normal. No tachypnea  or respiratory distress.     Breath sounds: Normal breath sounds. No decreased breath sounds, wheezing, rhonchi or rales.  Abdominal:     General: Bowel sounds are normal.     Palpations: Abdomen is soft.     Tenderness: There is no abdominal tenderness.  Skin:    General: Skin is warm and dry.     Findings: No rash.   Neurological:     Mental Status: Mental status is at baseline. She is disoriented.     Cranial Nerves: Cranial nerves are intact.     Sensory: Sensation is intact.     Motor: Motor function is intact.  Psychiatric:        Speech: She is noncommunicative.  Behavior: Behavior normal.        Cognition and Memory: Cognition is impaired. Memory is impaired. She exhibits impaired recent memory and impaired remote memory.        Judgment: Judgment is inappropriate.      Assessment and Plan The patient's preventative maintenance and recommended screening tests for an annual wellness exam were reviewed in full today. Brought up to date unless services declined.  Counselled on the importance of diet, exercise, and its role in overall health and mortality. The patient's FH and SH was reviewed, including their home life, tobacco status, and drug and alcohol status.   Uptodate with vaccines... flu Not interested in DEXA. No other prevention indicated given age and dementia.  Walking ome.    Hypothyroidism Stable on current dose levo.  Essential hypertension, benign  Good control.  Severe dementia ([place]) Dicussed home management issues. Continue current meds. Set up health care power if able to, to  clarify who makes descisions for her.  Consider option of  Memory care nursing home to improve her safety.    Eliezer Lofts, MD

## 2019-06-29 NOTE — Progress Notes (Signed)
No critical labs need to be addressed urgently. We will discuss labs in detail at upcoming office visit.   

## 2019-06-29 NOTE — Patient Instructions (Addendum)
Set up health care power if able to, to  clarify who makes descisions for her.  Consider option of  Memory care nursing home to improve her safety.

## 2019-07-09 ENCOUNTER — Ambulatory Visit: Payer: Medicare Other | Admitting: Family Medicine

## 2019-07-19 ENCOUNTER — Other Ambulatory Visit: Payer: Self-pay | Admitting: *Deleted

## 2019-07-19 NOTE — Telephone Encounter (Signed)
Last office visit 06/29/2019 for [place].  Last refilled 06/14/2019 for #30 with no refills.  No future appointments.

## 2019-07-20 MED ORDER — LORAZEPAM 0.5 MG PO TABS: ORAL_TABLET | ORAL | 0 refills | Status: DC

## 2019-07-28 ENCOUNTER — Other Ambulatory Visit: Payer: Self-pay | Admitting: *Deleted

## 2019-07-28 MED ORDER — CITALOPRAM HYDROBROMIDE 20 MG PO TABS: 20.0000 mg | ORAL_TABLET | Freq: Every day | ORAL | 1 refills | Status: DC

## 2019-08-05 NOTE — Assessment & Plan Note (Signed)
Dicussed home management issues. Continue current meds. Set up health care power if able to, to  clarify who makes descisions for her.  Consider option of  Memory care nursing home to improve her safety.

## 2019-08-05 NOTE — Assessment & Plan Note (Signed)
Stable on current dose levo.

## 2019-08-05 NOTE — Assessment & Plan Note (Signed)
Good control

## 2019-08-16 ENCOUNTER — Other Ambulatory Visit: Payer: Self-pay | Admitting: *Deleted

## 2019-08-16 MED ORDER — AMLODIPINE BESYLATE 5 MG PO TABS: 5.0000 mg | ORAL_TABLET | Freq: Every day | ORAL | 3 refills | Status: AC

## 2019-08-16 NOTE — Telephone Encounter (Signed)
Last office visit 06/29/2019 for [place].  Last refilled 07/20/2019 for #30 with no refills.  No future appointments.

## 2019-08-17 MED ORDER — LORAZEPAM 0.5 MG PO TABS: ORAL_TABLET | ORAL | 0 refills | Status: DC

## 2019-09-13 ENCOUNTER — Other Ambulatory Visit: Payer: Self-pay | Admitting: *Deleted

## 2019-09-13 MED ORDER — LEVOTHYROXINE SODIUM 100 MCG PO TABS: ORAL_TABLET | ORAL | 3 refills | Status: AC

## 2019-09-16 ENCOUNTER — Other Ambulatory Visit: Payer: Self-pay | Admitting: Family Medicine

## 2019-09-16 NOTE — Telephone Encounter (Signed)
Last office visit 06/29/2019 for [place].  Last refilled 08/17/2019 for #30 with no refills.  No future appointments.

## 2019-10-18 ENCOUNTER — Other Ambulatory Visit: Payer: Self-pay | Admitting: Family Medicine

## 2019-10-18 NOTE — Telephone Encounter (Signed)
Refill request Lorazepam Last office visit 06/29/19 Last refill 09/20/19 #30

## 2019-11-09 ENCOUNTER — Other Ambulatory Visit: Payer: Self-pay

## 2019-11-09 ENCOUNTER — Encounter: Payer: Self-pay | Admitting: Family Medicine

## 2019-11-09 ENCOUNTER — Ambulatory Visit (INDEPENDENT_AMBULATORY_CARE_PROVIDER_SITE_OTHER): Payer: Medicare Other | Admitting: Family Medicine

## 2019-11-09 VITALS — BP 124/80 | HR 63 | Ht 65.5 in | Wt 136.0 lb

## 2019-11-09 DIAGNOSIS — F0390 Unspecified dementia without behavioral disturbance: Secondary | ICD-10-CM

## 2019-11-09 DIAGNOSIS — R4182 Altered mental status, unspecified: Secondary | ICD-10-CM

## 2019-11-09 DIAGNOSIS — F03C0 Unspecified dementia, severe, without behavioral disturbance, psychotic disturbance, mood disturbance, and anxiety: Secondary | ICD-10-CM

## 2019-11-09 MED ORDER — LORAZEPAM 0.5 MG PO TABS: 0.5000 mg | ORAL_TABLET | Freq: Three times a day (TID) | ORAL | 0 refills | Status: DC | PRN
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
## 2019-11-09 NOTE — Patient Instructions (Signed)
Please stop at the lab to have labs drawn. Can use lorazepam for agitation 3 times daily as long as no sedation  

## 2019-11-10 LAB — CBC WITH DIFFERENTIAL/PLATELET
Basophils Absolute: 0.1 10*3/uL (ref 0.0–0.1)
Basophils Relative: 1 % (ref 0.0–3.0)
Eosinophils Absolute: 0.2 10*3/uL (ref 0.0–0.7)
Eosinophils Relative: 2 % (ref 0.0–5.0)
HCT: 47.5 % — ABNORMAL HIGH (ref 36.0–46.0)
Hemoglobin: 15.9 g/dL — ABNORMAL HIGH (ref 12.0–15.0)
Lymphocytes Relative: 23.2 % (ref 12.0–46.0)
Lymphs Abs: 2.5 10*3/uL (ref 0.7–4.0)
MCHC: 33.5 g/dL (ref 30.0–36.0)
MCV: 90.8 fl (ref 78.0–100.0)
Monocytes Absolute: 0.8 10*3/uL (ref 0.1–1.0)
Monocytes Relative: 7.2 % (ref 3.0–12.0)
Neutro Abs: 7.2 10*3/uL (ref 1.4–7.7)
Neutrophils Relative %: 66.6 % (ref 43.0–77.0)
Platelets: 278 10*3/uL (ref 150.0–400.0)
RBC: 5.23 Mil/uL — ABNORMAL HIGH (ref 3.87–5.11)
RDW: 13.6 % (ref 11.5–15.5)
WBC: 10.8 10*3/uL — ABNORMAL HIGH (ref 4.0–10.5)

## 2019-11-10 LAB — COMPREHENSIVE METABOLIC PANEL
ALT: 9 U/L (ref 0–35)
AST: 13 U/L (ref 0–37)
Albumin: 4.3 g/dL (ref 3.5–5.2)
Alkaline Phosphatase: 57 U/L (ref 39–117)
BUN: 16 mg/dL (ref 6–23)
CO2: 34 mEq/L — ABNORMAL HIGH (ref 19–32)
Calcium: 10 mg/dL (ref 8.4–10.5)
Chloride: 104 mEq/L (ref 96–112)
Creatinine, Ser: 0.76 mg/dL (ref 0.40–1.20)
GFR: 73.19 mL/min (ref >60.00)
Glucose, Bld: 86 mg/dL (ref 70–99)
Potassium: 3.5 mEq/L (ref 3.5–5.1)
Sodium: 142 mEq/L (ref 135–145)
Total Bilirubin: 0.7 mg/dL (ref 0.2–1.2)
Total Protein: 7.5 g/dL (ref 6.0–8.3)

## 2019-11-10 LAB — VITAMIN D 25 HYDROXY (VIT D DEFICIENCY, FRACTURES): VITD: 54.52 ng/mL (ref 30.00–100.00)

## 2019-11-10 LAB — VITAMIN B12: Vitamin B-12: 1481 pg/mL — ABNORMAL HIGH (ref 211–911)

## 2019-11-10 LAB — T3, FREE: T3, Free: 3.1 pg/mL (ref 2.3–4.2)

## 2019-11-10 LAB — T4, FREE: Free T4: 1.31 ng/dL (ref 0.60–1.60)

## 2019-11-10 LAB — TSH: TSH: 0.09 u[IU]/mL — ABNORMAL LOW (ref 0.35–4.50)

## 2019-11-12 ENCOUNTER — Other Ambulatory Visit: Payer: Self-pay | Admitting: Family Medicine

## 2019-11-12 MED ORDER — NITROFURANTOIN MONOHYD MACRO 100 MG PO CAPS: 100.0000 mg | ORAL_CAPSULE | Freq: Two times a day (BID) | ORAL | 0 refills | Status: DC

## 2019-11-19 ENCOUNTER — Telehealth: Payer: Self-pay

## 2019-11-19 MED ORDER — LORAZEPAM 0.5 MG PO TABS: 0.5000 mg | ORAL_TABLET | Freq: Three times a day (TID) | ORAL | 0 refills | Status: DC | PRN

## 2019-11-19 NOTE — Telephone Encounter (Signed)
Rx resent for 3 times a day 90 days.

## 2019-11-19 NOTE — Telephone Encounter (Signed)
At recent OV, it was discussed that pt takes up to 3 lorazepam a day and wanted a 90 day supply. Went to get the rx from the pharmacy and it was for #90. They were under the assumption that she would get enough to take 3 times a day with 90 day supply so total of $270. Please advise.

## 2019-12-08 ENCOUNTER — Telehealth: Payer: Self-pay

## 2019-12-08 NOTE — Telephone Encounter (Addendum)
Olivia Mackie (DPR signed) left v/m that she thinks pt has yeast infection due to vaginal and perineal itching. Olivia Mackie said pt cannot do cream and request diflucan. Did not leave pharmacy. Left v/m for Olivia Mackie to cb; has pt been on abx and any discharge? May need appt.  I spoke with Olivia Mackie this morning and pt did take abx couple of weeks ago; pt wears depends and may irritate pt. Cannot tell if any discharge; and pt is just itching in perineal area and not sure if vaginal area also. Olivia Mackie request diflucan instead of a cream. [facility].

## 2019-12-10 MED ORDER — FLUCONAZOLE 150 MG PO TABS: 150.0000 mg | ORAL_TABLET | Freq: Once | ORAL | 0 refills | Status: AC

## 2019-12-10 NOTE — Telephone Encounter (Signed)
Left message for Gloria Lloyd that Dr. Diona Browner sent her mom a Rx for Diflucan to [place].  I advised if not improving after taking the prescription, then she will need to be seen here in the office.

## 2019-12-10 NOTE — Telephone Encounter (Signed)
I will send in rx for diflucan but if not improving she should come in for eval.

## 2020-01-07 ENCOUNTER — Other Ambulatory Visit: Payer: Self-pay | Admitting: Family Medicine

## 2020-01-13 DIAGNOSIS — R4182 Altered mental status, unspecified: Secondary | ICD-10-CM | POA: Insufficient documentation

## 2020-01-13 NOTE — Assessment & Plan Note (Signed)
Please stop at the lab to have labs drawn. Can use lorazepam for agitation 3 times daily as long as no sedation

## 2020-01-13 NOTE — Assessment & Plan Note (Addendum)
Continue current medication. Discussed possible decline and inability to care for her at home. Family not interested in NH options at this point.

## 2020-01-28 ENCOUNTER — Other Ambulatory Visit: Payer: Self-pay | Admitting: Family Medicine

## 2020-01-28 NOTE — Telephone Encounter (Signed)
Last office visit 11/09/2019 for altered mental status.  Last refilled 11/19/2019 for #270 with no refills.  No future appointments.

## 2020-03-01 ENCOUNTER — Other Ambulatory Visit: Payer: Self-pay | Admitting: Family Medicine

## 2020-03-01 NOTE — Telephone Encounter (Signed)
Last office visit 11/09/2019 for altered mental status.  Last refilled 01/28/2020 for #90 with no refills.  No future appointments.

## 2020-04-13 ENCOUNTER — Other Ambulatory Visit: Payer: Self-pay | Admitting: Family Medicine

## 2020-04-13 NOTE — Telephone Encounter (Signed)
Last office visit 11/09/2019 for altered mental status.  Last refilled 03/01/2020 for #90 with no refills. No future appointments.

## 2020-05-06 ENCOUNTER — Telehealth: Payer: Self-pay | Admitting: Family Medicine

## 2020-05-06 NOTE — Telephone Encounter (Signed)
Received a call from nurse service that family would like to involve hospice for this patient with end stage ALZ dementia.  No acute change, they are not interested in taking her to the ER, etc. Advised nursing service that we probably can't get hospice started until Monday and they understand I did call [facility] and they will call me back- [facility] hospice is closed until Monday.  Will see if they are able to start services for her

## 2020-05-08 ENCOUNTER — Inpatient Hospital Stay (HOSPITAL_COMMUNITY)
Admission: EM | Admit: 2020-05-08 | Discharge: 2020-05-11 | DRG: 689 | Disposition: A | Discharge status: Hospice | Payer: Medicare Other | Attending: Family Medicine | Admitting: Family Medicine

## 2020-05-08 ENCOUNTER — Observation Stay (HOSPITAL_COMMUNITY): Payer: Medicare Other

## 2020-05-08 ENCOUNTER — Telehealth: Payer: Self-pay | Admitting: Family Medicine

## 2020-05-08 ENCOUNTER — Emergency Department (HOSPITAL_COMMUNITY): Payer: Medicare Other

## 2020-05-08 ENCOUNTER — Observation Stay: Payer: Medicare Other

## 2020-05-08 ENCOUNTER — Encounter (HOSPITAL_COMMUNITY): Payer: Self-pay | Admitting: Internal Medicine

## 2020-05-08 DIAGNOSIS — K2210 Ulcer of esophagus without bleeding: Secondary | ICD-10-CM | POA: Diagnosis present

## 2020-05-08 DIAGNOSIS — R54 Age-related physical debility: Secondary | ICD-10-CM | POA: Diagnosis not present

## 2020-05-08 DIAGNOSIS — G9340 Encephalopathy, unspecified: Secondary | ICD-10-CM | POA: Diagnosis not present

## 2020-05-08 DIAGNOSIS — Z86010 Personal history of colonic polyps: Secondary | ICD-10-CM

## 2020-05-08 DIAGNOSIS — R531 Weakness: Secondary | ICD-10-CM

## 2020-05-08 DIAGNOSIS — Z23 Encounter for immunization: Secondary | ICD-10-CM | POA: Diagnosis not present

## 2020-05-08 DIAGNOSIS — Z515 Encounter for palliative care: Secondary | ICD-10-CM | POA: Diagnosis not present

## 2020-05-08 DIAGNOSIS — F03C0 Unspecified dementia, severe, without behavioral disturbance, psychotic disturbance, mood disturbance, and anxiety: Secondary | ICD-10-CM | POA: Diagnosis present

## 2020-05-08 DIAGNOSIS — Z8249 Family history of ischemic heart disease and other diseases of the circulatory system: Secondary | ICD-10-CM

## 2020-05-08 DIAGNOSIS — I10 Essential (primary) hypertension: Secondary | ICD-10-CM | POA: Diagnosis present

## 2020-05-08 DIAGNOSIS — J90 Pleural effusion, not elsewhere classified: Secondary | ICD-10-CM | POA: Diagnosis not present

## 2020-05-08 DIAGNOSIS — Z20822 Contact with and (suspected) exposure to covid-19: Secondary | ICD-10-CM | POA: Diagnosis present

## 2020-05-08 DIAGNOSIS — R0902 Hypoxemia: Secondary | ICD-10-CM | POA: Diagnosis not present

## 2020-05-08 DIAGNOSIS — E039 Hypothyroidism, unspecified: Secondary | ICD-10-CM | POA: Diagnosis present

## 2020-05-08 DIAGNOSIS — M2550 Pain in unspecified joint: Secondary | ICD-10-CM | POA: Diagnosis not present

## 2020-05-08 DIAGNOSIS — K5790 Diverticulosis of intestine, part unspecified, without perforation or abscess without bleeding: Secondary | ICD-10-CM | POA: Diagnosis not present

## 2020-05-08 DIAGNOSIS — G929 Unspecified toxic encephalopathy: Secondary | ICD-10-CM | POA: Diagnosis not present

## 2020-05-08 DIAGNOSIS — R509 Fever, unspecified: Secondary | ICD-10-CM | POA: Diagnosis not present

## 2020-05-08 DIAGNOSIS — E876 Hypokalemia: Secondary | ICD-10-CM

## 2020-05-08 DIAGNOSIS — R52 Pain, unspecified: Secondary | ICD-10-CM | POA: Diagnosis not present

## 2020-05-08 DIAGNOSIS — N390 Urinary tract infection, site not specified: Secondary | ICD-10-CM | POA: Diagnosis not present

## 2020-05-08 DIAGNOSIS — Z7189 Other specified counseling: Secondary | ICD-10-CM | POA: Diagnosis not present

## 2020-05-08 DIAGNOSIS — Z66 Do not resuscitate: Secondary | ICD-10-CM | POA: Diagnosis present

## 2020-05-08 DIAGNOSIS — Z833 Family history of diabetes mellitus: Secondary | ICD-10-CM | POA: Diagnosis not present

## 2020-05-08 DIAGNOSIS — M109 Gout, unspecified: Secondary | ICD-10-CM | POA: Diagnosis present

## 2020-05-08 DIAGNOSIS — Z882 Allergy status to sulfonamides status: Secondary | ICD-10-CM | POA: Diagnosis not present

## 2020-05-08 DIAGNOSIS — R627 Adult failure to thrive: Secondary | ICD-10-CM | POA: Diagnosis not present

## 2020-05-08 DIAGNOSIS — Z880 Allergy status to penicillin: Secondary | ICD-10-CM

## 2020-05-08 DIAGNOSIS — R404 Transient alteration of awareness: Secondary | ICD-10-CM | POA: Diagnosis not present

## 2020-05-08 DIAGNOSIS — R17 Unspecified jaundice: Secondary | ICD-10-CM

## 2020-05-08 DIAGNOSIS — R410 Disorientation, unspecified: Secondary | ICD-10-CM

## 2020-05-08 DIAGNOSIS — F0390 Unspecified dementia without behavioral disturbance: Secondary | ICD-10-CM | POA: Diagnosis not present

## 2020-05-08 DIAGNOSIS — Z83438 Family history of other disorder of lipoprotein metabolism and other lipidemia: Secondary | ICD-10-CM | POA: Diagnosis not present

## 2020-05-08 DIAGNOSIS — Z79890 Hormone replacement therapy (postmenopausal): Secondary | ICD-10-CM | POA: Diagnosis not present

## 2020-05-08 DIAGNOSIS — Z87891 Personal history of nicotine dependence: Secondary | ICD-10-CM

## 2020-05-08 DIAGNOSIS — E890 Postprocedural hypothyroidism: Secondary | ICD-10-CM | POA: Diagnosis present

## 2020-05-08 DIAGNOSIS — Z6823 Body mass index (BMI) 23.0-23.9, adult: Secondary | ICD-10-CM

## 2020-05-08 DIAGNOSIS — E785 Hyperlipidemia, unspecified: Secondary | ICD-10-CM | POA: Diagnosis present

## 2020-05-08 DIAGNOSIS — Z7401 Bed confinement status: Secondary | ICD-10-CM | POA: Diagnosis not present

## 2020-05-08 DIAGNOSIS — I499 Cardiac arrhythmia, unspecified: Secondary | ICD-10-CM | POA: Diagnosis not present

## 2020-05-08 DIAGNOSIS — Z79899 Other long term (current) drug therapy: Secondary | ICD-10-CM | POA: Diagnosis not present

## 2020-05-08 DIAGNOSIS — Z743 Need for continuous supervision: Secondary | ICD-10-CM | POA: Diagnosis not present

## 2020-05-08 LAB — TSH: TSH: 0.506 u[IU]/mL (ref 0.350–4.500)

## 2020-05-08 LAB — PROTIME-INR
INR: 1.3 — ABNORMAL HIGH (ref 0.8–1.2)
Prothrombin Time: 15.2 seconds (ref 11.4–15.2)

## 2020-05-08 LAB — CBC WITH DIFFERENTIAL/PLATELET
Abs Immature Granulocytes: 0.12 10*3/uL — ABNORMAL HIGH (ref 0.00–0.07)
Basophils Absolute: 0.1 10*3/uL (ref 0.0–0.1)
Basophils Relative: 0 %
Eosinophils Absolute: 0 10*3/uL (ref 0.0–0.5)
Eosinophils Relative: 0 %
HCT: 48.4 % — ABNORMAL HIGH (ref 36.0–46.0)
Hemoglobin: 15.8 g/dL — ABNORMAL HIGH (ref 12.0–15.0)
Immature Granulocytes: 1 %
Lymphocytes Relative: 9 %
Lymphs Abs: 1.8 10*3/uL (ref 0.7–4.0)
MCH: 29.5 pg (ref 26.0–34.0)
MCHC: 32.6 g/dL (ref 30.0–36.0)
MCV: 90.3 fL (ref 80.0–100.0)
Monocytes Absolute: 1.6 10*3/uL — ABNORMAL HIGH (ref 0.1–1.0)
Monocytes Relative: 8 %
Neutro Abs: 15.6 10*3/uL — ABNORMAL HIGH (ref 1.7–7.7)
Neutrophils Relative %: 82 %
Platelets: 251 10*3/uL (ref 150–400)
RBC: 5.36 MIL/uL — ABNORMAL HIGH (ref 3.87–5.11)
RDW: 12.5 % (ref 11.5–15.5)
WBC: 19.2 10*3/uL — ABNORMAL HIGH (ref 4.0–10.5)
nRBC: 0 % (ref 0.0–0.2)

## 2020-05-08 LAB — URINALYSIS, ROUTINE W REFLEX MICROSCOPIC
Bilirubin Urine: NEGATIVE
Glucose, UA: NEGATIVE mg/dL
Ketones, ur: NEGATIVE mg/dL
Nitrite: NEGATIVE
Protein, ur: 100 mg/dL — AB
RBC / HPF: >50 RBC/hpf — ABNORMAL HIGH (ref 0–5)
Specific Gravity, Urine: 1.009 (ref 1.005–1.030)
WBC, UA: >50 WBC/hpf — ABNORMAL HIGH (ref 0–5)
pH: 5 (ref 5.0–8.0)

## 2020-05-08 LAB — COMPREHENSIVE METABOLIC PANEL
ALT: 9 U/L (ref 0–44)
AST: 19 U/L (ref 15–41)
Albumin: 2.8 g/dL — ABNORMAL LOW (ref 3.5–5.0)
Alkaline Phosphatase: 47 U/L (ref 38–126)
Anion gap: 11 (ref 5–15)
BUN: 16 mg/dL (ref 8–23)
CO2: 25 mmol/L (ref 22–32)
Calcium: 8.8 mg/dL — ABNORMAL LOW (ref 8.9–10.3)
Chloride: 100 mmol/L (ref 98–111)
Creatinine, Ser: 0.78 mg/dL (ref 0.44–1.00)
GFR, Estimated: >60 mL/min (ref >60)
Glucose, Bld: 125 mg/dL — ABNORMAL HIGH (ref 70–99)
Potassium: 3.3 mmol/L — ABNORMAL LOW (ref 3.5–5.1)
Sodium: 136 mmol/L (ref 135–145)
Total Bilirubin: 2.2 mg/dL — ABNORMAL HIGH (ref 0.3–1.2)
Total Protein: 6.5 g/dL (ref 6.5–8.1)

## 2020-05-08 LAB — LACTIC ACID, PLASMA
Lactic Acid, Venous: 1.2 mmol/L (ref 0.5–1.9)
Lactic Acid, Venous: 1.2 mmol/L (ref 0.5–1.9)

## 2020-05-08 LAB — APTT: aPTT: 27 seconds (ref 24–36)

## 2020-05-08 LAB — RESP PANEL BY RT-PCR (FLU A&B, COVID) ARPGX2
Influenza A by PCR: NEGATIVE
Influenza B by PCR: NEGATIVE
SARS Coronavirus 2 by RT PCR: NEGATIVE

## 2020-05-08 MED ORDER — ENOXAPARIN SODIUM 40 MG/0.4ML ~~LOC~~ SOLN
40.0000 mg | SUBCUTANEOUS | Status: DC
  Administered 2020-05-08 – 2020-05-11 (×4): 40 mg via SUBCUTANEOUS
  Filled 2020-05-08 (×4): qty 0.4

## 2020-05-08 MED ORDER — ACETAMINOPHEN 650 MG RE SUPP
650.0000 mg | RECTAL | Status: DC | PRN
  Administered 2020-05-09 – 2020-05-10 (×2): 650 mg via RECTAL
  Filled 2020-05-08 (×2): qty 1

## 2020-05-08 MED ORDER — LACTATED RINGERS IV SOLN: INTRAVENOUS | Status: DC

## 2020-05-08 MED ORDER — ONDANSETRON HCL 4 MG PO TABS: 4.0000 mg | ORAL_TABLET | Freq: Four times a day (QID) | ORAL | Status: DC | PRN

## 2020-05-08 MED ORDER — SODIUM CHLORIDE 0.9 % IV SOLN
2.0000 g | Freq: Once | INTRAVENOUS | Status: AC
  Administered 2020-05-08: 2 g via INTRAVENOUS
  Filled 2020-05-08: qty 20

## 2020-05-08 MED ORDER — HYDRALAZINE HCL 20 MG/ML IJ SOLN: 5.0000 mg | INTRAMUSCULAR | Status: DC | PRN

## 2020-05-08 MED ORDER — MORPHINE SULFATE (PF) 2 MG/ML IV SOLN
2.0000 mg | INTRAVENOUS | Status: DC | PRN
  Administered 2020-05-08 – 2020-05-09 (×2): 2 mg via INTRAVENOUS
  Filled 2020-05-08 (×2): qty 1

## 2020-05-08 MED ORDER — BISACODYL 5 MG PO TBEC: 5.0000 mg | DELAYED_RELEASE_TABLET | Freq: Every day | ORAL | Status: DC | PRN

## 2020-05-08 MED ORDER — POTASSIUM CHLORIDE 20 MEQ PO PACK
40.0000 meq | PACK | Freq: Once | ORAL | Status: AC
  Administered 2020-05-08: 40 meq via ORAL
  Filled 2020-05-08 (×2): qty 2

## 2020-05-08 MED ORDER — HYDROCODONE-ACETAMINOPHEN 5-325 MG PO TABS: 1.0000 | ORAL_TABLET | ORAL | Status: DC | PRN

## 2020-05-08 MED ORDER — ALBUTEROL SULFATE (2.5 MG/3ML) 0.083% IN NEBU: 2.5000 mg | INHALATION_SOLUTION | RESPIRATORY_TRACT | Status: DC | PRN

## 2020-05-08 MED ORDER — POLYETHYLENE GLYCOL 3350 17 G PO PACK: 17.0000 g | PACK | Freq: Every day | ORAL | Status: DC | PRN

## 2020-05-08 MED ORDER — ACETAMINOPHEN 650 MG RE SUPP: 650.0000 mg | Freq: Four times a day (QID) | RECTAL | Status: DC | PRN

## 2020-05-08 MED ORDER — POTASSIUM CHLORIDE CRYS ER 20 MEQ PO TBCR
40.0000 meq | EXTENDED_RELEASE_TABLET | Freq: Once | ORAL | Status: AC
  Filled 2020-05-08: qty 2

## 2020-05-08 MED ORDER — ONDANSETRON HCL 4 MG/2ML IJ SOLN
4.0000 mg | Freq: Four times a day (QID) | INTRAMUSCULAR | Status: DC | PRN
  Administered 2020-05-08: 4 mg via INTRAVENOUS
  Filled 2020-05-08: qty 2

## 2020-05-08 MED ORDER — ACETAMINOPHEN 325 MG PO TABS: 650.0000 mg | ORAL_TABLET | Freq: Four times a day (QID) | ORAL | Status: DC | PRN

## 2020-05-08 MED ORDER — DOCUSATE SODIUM 100 MG PO CAPS
100.0000 mg | ORAL_CAPSULE | Freq: Two times a day (BID) | ORAL | Status: DC
  Filled 2020-05-08: qty 1

## 2020-05-08 MED ORDER — SODIUM CHLORIDE 0.9% FLUSH
3.0000 mL | Freq: Two times a day (BID) | INTRAVENOUS | Status: DC
  Administered 2020-05-08 – 2020-05-11 (×2): 3 mL via INTRAVENOUS

## 2020-05-08 MED ORDER — LORAZEPAM 2 MG/ML IJ SOLN
1.0000 mg | Freq: Once | INTRAMUSCULAR | Status: DC
  Filled 2020-05-08: qty 1

## 2020-05-08 MED ORDER — INFLUENZA VAC A&B SA ADJ QUAD 0.5 ML IM PRSY
0.5000 mL | PREFILLED_SYRINGE | INTRAMUSCULAR | Status: AC
  Administered 2020-05-11: 0.5 mL via INTRAMUSCULAR
  Filled 2020-05-08: qty 0.5

## 2020-05-08 NOTE — Evaluation (Signed)
Clinical/Bedside Swallow Evaluation Patient Details  Name: Gloria Lloyd MRN: [id number] Date of Birth: April 01, 1941  Today's Date: 05/08/2020 Time: SLP Start Time (ACUTE ONLY): 1340 SLP Stop Time (ACUTE ONLY): 1350 SLP Time Calculation (min) (ACUTE ONLY): 10 min  Past Medical History:  Past Medical History:  Diagnosis Date  . Adenomatous polyp of colon 04/2005  . Dementia ([place])   . Diverticulosis   . Erosive esophagitis   . Gout   . Hiatal hernia   . Hyperlipidemia   . Hypertension   . Thyroid disease    Past Surgical History:  Past Surgical History:  Procedure Laterality Date  . THYROIDECTOMY, PARTIAL    . TONSILLECTOMY     HPI:  Gloria Lloyd is a 79 y.o. female admitted with probably UTI, seen in ED. Pt with medical history significant of hypothyroidism; dementia; HTN; and HLD presenting with AMS.  Her husband reports that Friday AM, she was less responsive. Her legs were weak, they couldn't get her out of bed, more somnolent than usual. At baseline, she requires full assistance with ADLs other than generally able to feed herself if it is in front of her.  Nonverbal, nonsensical speech. She sleeps most of the day normally.    Assessment / Plan / Recommendation Clinical Impression  Pt was seen for BSE and was alert and pleasant. Pt presents with some confusion from dementia and probable UTI, requiring min verbal cues to attend to POs. An oral mech exam was unable to be completed due to the pt's confusion and difficulty following directions. She was observed with thin liquid, puree, and solids. All POs were tolerated without s/sx of aspiration. Pt's daughter was educated on hand-over-hand assistance for engaging the pt in meals. Recommend regular diet and thin liquids, meds crushed in puree. SLP will sign off at this time.  SLP Visit Diagnosis: Dysphagia, unspecified (R13.10)    Aspiration Risk  Mild aspiration risk    Diet Recommendation Regular;Thin liquid   Liquid  Administration via: Cup;Straw Medication Administration: Crushed with puree Supervision: Full supervision/cueing for compensatory strategies;Staff to assist with self feeding Postural Changes: Seated upright at 90 degrees    Other  Recommendations Oral Care Recommendations: Oral care BID   Follow up Recommendations        Frequency and Duration            Prognosis        Swallow Study   General HPI: Gloria Lloyd is a 79 y.o. female admitted with probably UTI, seen in ED. Pt with medical history significant of hypothyroidism; dementia; HTN; and HLD presenting with AMS.  Her husband reports that Friday AM, she was less responsive. Her legs were weak, they couldn't get her out of bed, more somnolent than usual. At baseline, she requires full assistance with ADLs other than generally able to feed herself if it is in front of her.  Nonverbal, nonsensical speech. She sleeps most of the day normally.  Type of Study: Bedside Swallow Evaluation Diet Prior to this Study: NPO Temperature Spikes Noted: No Respiratory Status: Room air History of Recent Intubation: No Behavior/Cognition: Alert;Requires cueing;Pleasant mood Oral Cavity Assessment: Other (comment) (could not fully assess 2/2  pt confusion) Oral Care Completed by SLP: No Self-Feeding Abilities: Needs assist Patient Positioning: Upright in bed Baseline Vocal Quality: Normal    Oral/Motor/Sensory Function Overall Oral Motor/Sensory Function: Other (comment) (unable to complete due to pt's confusion)   Ice Chips Ice chips: Not tested   Thin Liquid  Thin Liquid: Within functional limits Presentation: Straw    Nectar Thick Nectar Thick Liquid: Not tested   Honey Thick Honey Thick Liquid: Not tested   Puree Puree: Within functional limits Presentation: Spoon   Solid     Solid: Within functional limits      Greggory Keen 05/08/2020,2:13 PM

## 2020-05-08 NOTE — Progress Notes (Signed)
   Covid-19 Vaccination Clinic  Name:  Gloria Lloyd    MRN: [id number] DOB: 06-24-1940  05/08/2020  Gloria Lloyd was observed post Covid-19 immunization for 15 minutes without incident. She was provided with Vaccine Information Sheet and instruction to access the V-Safe system.   Gloria Lloyd was instructed to call 911 with any severe reactions post vaccine: Marland Kitchen Difficulty breathing  . Swelling of face and throat  . A fast heartbeat  . A bad rash all over body  . Dizziness and weakness   Immunizations Administered    No immunizations on file.

## 2020-05-08 NOTE — Telephone Encounter (Signed)
Appears pt now at ED. Hospice is likely appropriate.. will monitor ED dispo.

## 2020-05-08 NOTE — ED Triage Notes (Signed)
Pt BIB EMS from home for c/o increased weakness over last 2 days. Unable to get out of bed and go to bathroom. Normally able to ambulate, now unable since Thanksgiving. Husband called 10 for increased AMS and increased weakness.   Hx dementia  Alert with EMS 103/70 Pulse 90 100% 2L [place] RR22 Capnography 19-24 Temp 98 CBG 129 Sinus Tach at 102, NSR upon arrival to [facility] ED  Received 400 NS 20G L. Forearm

## 2020-05-08 NOTE — Consult Note (Signed)
                                                                                 Consultation Note Date: 05/08/2020   Patient Name: Gloria Lloyd  DOB: 02/08/1941  MRN: [id number]  Age / Sex: 79 y.o., female  PCP: Bedsole, Amy E, MD Referring Physician: Yates, Jennifer, MD  Reason for Consultation: Establishing goals of care  HPI/Patient Profile: 79 y.o. female  with past medical history of dementia, hypertension, and hyperlipidemia presenting to the emergency department on 05/08/2020 with AMS. Family noticed her to be less responsive about 3 days prior, and they were unable to get her out of bed.  In the ED, urinalysis is concerning for UTI and she was treated with ceftriaxone. Admitted to [facility] for management of acute metabolic encephalopathy.   Clinical Assessment and Goals of Care: I have reviewed medical records including EPIC notes, labs and imaging, examined the patient and met at bedside with husband (Rusty) and daughter to discuss diagnosis, prognosis, GOC, EOL wishes, disposition, and options.  I introduced Palliative Medicine as specialized medical care for people living with serious illness. It focuses on providing relief from the symptoms and stress of a serious illness.   We discussed a brief life review of the patient. 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 She and Rusty have been married for 61 years. They have 2 daughters. Patient lives at home with Rusty and 1 of the daughters.   As far as functional status, family reports a decline over the past month. She is able to ambulate only with assistance; her gait has become progressively unsteady. She requires full assistance with ADLs. She is incontinent. Her speech is mostly nonsensical. Of note, her albumin is currently 2.8, decreased from 4.3 in June.      We discussed her current illness and what it means in the larger context of her ongoing co-morbidities.  Detailed discussion was had regarding the diagnosis of  dementia and its natural trajectory. This includes decreased ability to communicate, ambulate, swallow, and maintain continence. Discussed that dementia is ultimately a terminal condition.  I attempted to elicit values and goals of care important to the patient. The family wants her to remain at home as long as possible. They would not want her in a facility.   The difference between aggressive medical intervention and comfort care was considered in light of the patient's goals of care. Family is not interested in aggressive medical interventions, but would like to maintain her quality of life as long as possible.   Advanced directives, concepts specific to code status, artifical feeding and hydration, and rehospitalization were considered and discussed. I confirmed code status is DNR.  Hospice and Palliative Care services outpatient were explained and offered. Rusty states they recently had received information from a hospice company ([facility]) about services in the home. They are open and agreeable to enrolling in hospice care if she is eligible.   Questions and concerns were addressed.  The family was encouraged to call with questions or concerns.    Primary decision maker: Rusty Calleros (husband) [phone]    SUMMARY OF RECOMMENDATIONS   - DNR/DNI   as previously documented - continue current medical care - goal of care is for patient to remain at home for as long as possible - family is interested in obtaining hospice services at home - placed TOC order - PMT will continue to follow  Code Status/Advance Care Planning:  DNR  Symptom Management:   Per primary team  Palliative Prophylaxis:   Aspiration, Oral Care and Turn Reposition  Additional Recommendations (Limitations, Scope, Preferences):  No aggressive medical interventions  Psycho-social/Spiritual:   Created space and opportunity for patient and family to express thoughts and feelings regarding patient's current  medical situation.   Emotional support provided   Prognosis:   < 6 months  Discharge Planning: to be determined      Primary Diagnoses: Present on Admission: . Acute encephalopathy . Hypothyroidism . Hyperlipidemia . Essential hypertension, benign . Severe dementia (HCC)   I have reviewed the medical record, interviewed the patient and family, and examined the patient. The following aspects are pertinent.  Past Medical History:  Diagnosis Date  . Adenomatous polyp of colon 04/2005  . Dementia (HCC)   . Diverticulosis   . Erosive esophagitis   . Gout   . Hiatal hernia   . Hyperlipidemia   . Hypertension   . Thyroid disease      Family History  Problem Relation Age of Onset  . Diabetes Mother   . Hyperlipidemia Mother   . Hypertension Mother   . Hyperthyroidism Mother   . Coronary artery disease Maternal Grandmother    Scheduled Meds: . enoxaparin (LOVENOX) injection  40 mg Subcutaneous Q24H  . [START ON 05/09/2020] influenza vaccine adjuvanted  0.5 mL Intramuscular Tomorrow-1000  . sodium chloride flush  3 mL Intravenous Q12H   Continuous Infusions: . lactated ringers 100 mL/hr at 05/08/20 1115   PRN Meds:.acetaminophen, albuterol, hydrALAZINE, morphine injection, [DISCONTINUED] ondansetron **OR** ondansetron (ZOFRAN) IV Medications Prior to Admission:  Prior to Admission medications   Medication Sig Start Date End Date Taking? Authorizing Provider  amLODipine (NORVASC) 5 MG tablet Take 1 tablet (5 mg total) by mouth daily. 08/16/19  Yes Bedsole, Amy E, MD  Cholecalciferol (VITAMIN D3 PO) Take 1 capsule by mouth daily.   Yes [provider]  citalopram (CELEXA) 20 MG tablet TAKE 1 TABLET(20 MG) BY MOUTH AT BEDTIME Patient taking differently: Take 20 mg by mouth at bedtime.  01/07/20  Yes Bedsole, Amy E, MD  donepezil (ARICEPT) 10 MG tablet TAKE 1 TABLET BY MOUTH AT BEDTIME Patient taking differently: Take 10 mg by mouth at bedtime.  09/16/19  Yes  Bedsole, Amy E, MD  levothyroxine (SYNTHROID) 100 MCG tablet TAKE 1 TABLET(100 MCG) BY MOUTH DAILY Patient taking differently: Take 100 mcg by mouth daily.  09/13/19  Yes Bedsole, Amy E, MD  LORazepam (ATIVAN) 0.5 MG tablet TAKE 1 TABLET(0.5 MG) BY MOUTH EVERY 8 HOURS AS NEEDED FOR ANXIETY Patient taking differently: Take 0.5 mg by mouth every 8 (eight) hours as needed for anxiety.  04/13/20  Yes Bedsole, Amy E, MD   Allergies  Allergen Reactions  . Penicillins     REACTION: As a child  . Sulfonamide Derivatives     REACTION: Rash, itch.   Review of Systems  Unable to perform ROS: Dementia    Physical Exam Vitals reviewed.  Constitutional:      General: She is not in acute distress. Cardiovascular:     Rate and Rhythm: Normal rate and regular rhythm.  Pulmonary:     Effort: Pulmonary effort   is normal.  Neurological:     Mental Status: She is alert. She is confused.     Vital Signs: BP 122/76 (BP Location: Left Arm)   Pulse 78   Temp 99.4 F (37.4 C) (Oral)   Resp 16   Ht 5' 6" (1.676 m)   Wt 63.5 kg   SpO2 93%   BMI 22.60 kg/m  Pain Scale: PAINAD      SpO2: SpO2: 93 % O2 Device:SpO2: 93 % O2 Flow Rate: .O2 Flow Rate (L/min): 2 L/min (Ok'd per RN Marcie Bal V)  IO: Intake/output summary: No intake or output data in the 24 hours ending 05/08/20 1857  LBM:   Baseline Weight: Weight: 63.5 kg Most recent weight: Weight: 63.5 kg      Palliative Assessment/Data: PPS 40%     Time In: 17:38 Time Out: 18:50 Time Total: 72 minutes Greater than 50%  of this time was spent counseling and coordinating care related to the above assessment and plan.  Signed by: Lavena Bullion, NP   Please contact Palliative Medicine Team phone at [phone] for questions and concerns.  For individual provider: See Shea Evans

## 2020-05-08 NOTE — Progress Notes (Signed)
NEW ADMISSION NOTE New Admission Note:   Arrival Method: via stretcher from ED Mental Orientation: alert to self Telemetry: box 15; CCMD notified Assessment: Completed Skin: intact IV: R and L FA Pain: 0-10 Tubes: none Safety Measures: Safety Fall Prevention Plan has been given, discussed and signed Admission: in progress [facility] [place] Orientation: Patient and family has been orientated to the room, unit and staff.  Family: daughter at bedside  Orders have been reviewed and implemented. Will continue to monitor the patient. Call light has been placed within reach and bed alarm has been activated.   Orville Govern, RN

## 2020-05-08 NOTE — ED Notes (Addendum)
Daughter playing Elvis music on her phone for patient. Patient can be seen smiling, singing, and talking in small sentences.

## 2020-05-08 NOTE — ED Notes (Signed)
Lunch Tray Ordered @ 1027. °

## 2020-05-08 NOTE — Telephone Encounter (Signed)
[facility] Night - Client TELEPHONE ADVICE RECORD [facility] Patient Name: Gloria Lloyd Gender: Female DOB: 06/16/1940 Age: 79 Y 31 M 2 D Return Phone Number: [phone] (Primary) Address: City/State/Zip: [place] [address] Client [facility] Night - Client Client Site [place] Physician Eliezer Lofts - MD Contact Type Call Who Is Calling Patient / Member / Family / Caregiver Call Type Triage / Clinical Caller Name rusty Lorimer Relationship To Patient Spouse Return Phone Number [phone] (Primary) Chief Complaint DIFFICULTY TO AWAKEN in anyone Reason for Call Symptomatic / Request for [place] states mom has alzheimers aggressive stage and wont get out of bed past few days. Wants to sleep a lot and not really responding well. Translation No Nurse Assessment Nurse: Donna Christen, RN, Legrand Como Date/Time Eilene Ghazi Time): 05/06/2020 4:28:13 PM Confirm and document reason for call. If symptomatic, describe symptoms. ---Caller states mom has Alzheimer's aggressive stage and wont get out of bed past few days. Wants to sleep a lot and not really responding well. Unable to walk and is sleeping most of the time. Does the patient have any new or worsening symptoms? ---Yes Will a triage be completed? ---Yes Related visit to physician within the last 2 weeks? ---No Does the PT have any chronic conditions? (i.e. diabetes, asthma, this includes High risk factors for pregnancy, etc.) ---Yes List chronic conditions. ---Alzheimer's, HTN Is this a behavioral health or substance abuse call? ---No Guidelines Guideline Title Affirmed Question Affirmed Notes Nurse Date/Time Eilene Ghazi Time) Hospice - Death Imminent or Dying Symptoms Patient or caregiver has URGENT question and triager unable to answer question Darleen Crocker 05/06/2020 4:29:54 PM Disp. Time Eilene Ghazi Time)  Disposition Final User 05/06/2020 4:24:26 PM Send to Urgent Queue Shirlee Latch 05/06/2020 4:34:17 PM Called On-Call Provider Donna Christen, RN, Legrand Como 05/06/2020 4:31:47 PM See Hospice Nurse Within 4 Hours Yes Donna Christen, RN, Legrand Como PLEASE NOTE: All timestamps contained within this report are represented as [place] Standard Time. CONFIDENTIALTY NOTICE: This fax transmission is intended only for the addressee. It contains information that is legally privileged, confidential or otherwise protected from use or disclosure. If you are not the intended recipient, you are strictly prohibited from reviewing, disclosing, copying using or disseminating any of this information or taking any action in reliance on or regarding this information. If you have received this fax in error, please notify us immediately by telephone so that we can arrange for its return to [place]. Phone: [phone], Toll-Free: [phone], Fax: [phone] Page: 2 of 2 Call Id: [id number] [place] Disagree/Comply Disagree Caller Understands Yes PreDisposition InappropriateToAsk Care Advice Given Per Guideline * Based on what you have told me, I think that a visit by a hospice nurse may be needed within the next 4 hours. HOSPICE NURSE VISIT (OR HOSPICE NURSE FOLLOW-UP) WITHIN 4 HOURS: CALL BACK IF: CARE ADVICE given per Hospice - Death Imminent or Dying Symptoms (Adult) guideline. * You have more questions * Your loved one (patient) become worse * Your loved one passes on (dies) Comments User: Tresa Moore, RN Date/Time (Eastern Time): 05/06/2020 4:36:30 PM Family states that they do not want to take her to the hospital. Per Dr. Lorelei Pont should follow up with office on Monday for Hospice. User: Tresa Moore, RN Date/Time Eilene Ghazi Time): 05/06/2020 4:38:41 PM Spoke with caller and informed him that MD would send message to her PCP for hospice. Urged him to callback with further questions or worsening symptoms. verbalized  understanding. Referrals  GO TO FACILITY REFUSED Paging DoctorName Phone DateTime Result/Outcome Message Type Notes Lamar Blinks MD [phone] 05/06/2020 4:34:17 PM Called On Call Provider - Reached Doctor Paged Lamar Blinks- MD 05/06/2020 4:35:49 PM Spoke with On Call - General Message Result Let them know that if they do not want to take her in to be hospitalized should follow up with Dr. Diona Browner on Monday. If anything changes in the meantime have them call back. Will send message to PCP to get Hospice process started.

## 2020-05-08 NOTE — Progress Notes (Signed)
Palliative Medicine RN Note: Consult order noted for [place]. We will see this patient when we have an available provider.   I called the [facility] intake dept to discuss their visit with Mrs Moyd. They report that she was seen 11/28 but declined to enroll, as they planned to seek aggressive work up for mental status changes at the hospital. Per the intake dept at [facility], family reported that she stopped eating on Friday 11/26.  [facility] can be reached at [phone].  Marjie Skiff Emilian Stawicki, RN, BSN, [facility] Palliative Medicine Team 05/08/2020 11:46 AM Office [phone]

## 2020-05-08 NOTE — H&P (Signed)
History and Physical    Gloria Lloyd MRN:[id number] DOB: 08/10/1940 DOA: 05/08/2020  PCP: Bedsole, Amy E, MD Consultants:  None Patient coming from: Home - lives with husband and daughter; NOK: Husband, Rusty Gilpatrick, [phone]   Chief Complaint: AMS  HPI: Gloria Lloyd is a 79 y.o. female with medical history significant of hypothyroidism; dementia; HTN; and HLD presenting with AMS.  Her husband reports that she has a difficult time getting around and requires assistance for ambulation.  They had Thanksgiving Thursday without event.  Friday AM, he was less responsive. Her legs were weak, they couldn't get her out of bed, more somnolent than usual.  Yesterday they spoke with hospice about maybe starting that process.  She has been kind of out of it and with nonsensical speech (this is baseline).  Last night, their daughter felt like she was upset and needed to come in.   At baseline, she requires full assistance with ADLs other than generally able to feed herself if it is in front of her.  Nonverbal, nonsensical speech.  Ambulates with assistance from family, does not use a walker.  Wears Depends.  She sleeps most of the day normally - gets up about 10AM to clean up and go to the bathroom and goes back to bed until lunchtime.  He gets her up then and gets her breakfast and then gets her dressed.  She sits up and watches TV and snoozes through the day until bedtime at 11PM.    ED Course:  Carryover, per Dr. Kakrakandy:  79-year-old female with dementia and hypertension was brought in for acute change in mental status and ER physician feels her symptoms are likely from UTI he was started on ceftriaxone admitted for further observation.   Review of Systems: Unable to perform  Ambulatory Status:  Ambulates with assistance  COVID Vaccine Status:   Complete - would like booster here, received Moderna prior  Past Medical History:  Diagnosis Date  . Adenomatous polyp of colon 04/2005   . Dementia (H[facility])   . Diverticulosis   . Erosive esophagitis   . Gout   . Hiatal hernia   . Hyperlipidemia   . Hypertension   . Thyroid disease     Past Surgical History:  Procedure Laterality Date  . THYROIDECTOMY, PARTIAL    . TONSILLECTOMY      Social History   Socioeconomic History  . Marital status: Married    Spouse name: Not on file  . Number of children: 2  . Years of education: Not on file  . Highest education level: Not on file  Occupational History  . Occupation: retired    Employer: RETIRED  Tobacco Use  . Smoking status: Former Smoker    Years: 5.00    Quit date: 1980    Years since quitting: 41.9  . Smokeless tobacco: Never Used  Vaping Use  . Vaping Use: Never used  Substance and Sexual Activity  . Alcohol use: Not Currently    Alcohol/week: 0.0 standard drinks    Comment: occasional  . Drug use: No  . Sexual activity: Not Currently  Other Topics Concern  . Not on file  Social History Narrative   Pt lives in 2 story home with her husband   Has 2 adult children   Some college education   Last employment - [employer]                Social Determinants of Health   Financial Resource   Strain:   . Difficulty of Paying Living Expenses: Not on file  Food Insecurity:   . Worried About Charity fundraiser in the Last Year: Not on file  . Ran Out of Food in the Last Year: Not on file  Transportation Needs:   . Lack of Transportation (Medical): Not on file  . Lack of Transportation (Non-Medical): Not on file  Physical Activity:   . Days of Exercise per Week: Not on file  . Minutes of Exercise per Session: Not on file  Stress:   . Feeling of Stress : Not on file  Social Connections:   . Frequency of Communication with Friends and Family: Not on file  . Frequency of Social Gatherings with Friends and Family: Not on file  . Attends Religious Services: Not on file  . Active Member of Clubs or Organizations: Not on file  . Attends  Archivist Meetings: Not on file  . Marital Status: Not on file  Intimate Partner Violence:   . Fear of Current or Ex-Partner: Not on file  . Emotionally Abused: Not on file  . Physically Abused: Not on file  . Sexually Abused: Not on file    Allergies  Allergen Reactions  . Penicillins     REACTION: As a child  . Sulfonamide Derivatives     REACTION: Rash, itch.    Family History  Problem Relation Age of Onset  . Diabetes Mother   . Hyperlipidemia Mother   . Hypertension Mother   . Hyperthyroidism Mother   . Coronary artery disease Maternal Grandmother     Prior to Admission medications   Medication Sig Start Date End Date Taking? Authorizing Provider  amLODipine (NORVASC) 5 MG tablet Take 1 tablet (5 mg total) by mouth daily. 08/16/19   Bedsole, Amy E, MD  Cholecalciferol (VITAMIN D3 PO) Take 1 capsule by mouth daily.    [provider]  citalopram (CELEXA) 20 MG tablet TAKE 1 TABLET(20 MG) BY MOUTH AT BEDTIME 01/07/20   Bedsole, Amy E, MD  donepezil (ARICEPT) 10 MG tablet TAKE 1 TABLET BY MOUTH AT BEDTIME 09/16/19   Bedsole, Amy E, MD  levothyroxine (SYNTHROID) 100 MCG tablet TAKE 1 TABLET(100 MCG) BY MOUTH DAILY 09/13/19   Bedsole, Amy E, MD  LORazepam (ATIVAN) 0.5 MG tablet TAKE 1 TABLET(0.5 MG) BY MOUTH EVERY 8 HOURS AS NEEDED FOR ANXIETY 04/13/20   Bedsole, Amy E, MD  nitrofurantoin, macrocrystal-monohydrate, (MACROBID) 100 MG capsule Take 1 capsule (100 mg total) by mouth 2 (two) times daily. 11/12/19   Jinny Sanders, MD    Physical Exam: Vitals:   05/08/20 1145 05/08/20 1200 05/08/20 1245 05/08/20 1300  BP: 115/80 (!) 104/92 108/78 110/76  Pulse: 77 77 65 65  Resp: 20 19 (!) 9 15  Temp:      TempSrc:      SpO2: 93% 94% 94% 94%  Weight:      Height:         . General:  Appears calm and comfortable but no attempts at communication, appears unengaged but will open eyes to voice . Eyes:  normal lids, iris, opens eyes to touch . ENT:  grossly normal  lips; tightly closes mouth and will not follow commands . Neck:  no LAD, masses or thyromegaly . Cardiovascular:  RRR, no m/r/g. No LE edema.  Marland Kitchen Respiratory:   CTA bilaterally with no wheezes/rales/rhonchi.  Normal respiratory effort. . Abdomen:  soft, NT, ND, NABS . Skin:  no rash  or induration seen on limited exam . Musculoskeletal:   no bony abnormality . Psychiatric:  Opens eyes to touch, no attempt at verbalization, appears disengaged Neurologic:  Unable to perform; ?right-sided neglect (hard to definitively say if this is the case)   Radiological Exams on Admission: Independently reviewed - see discussion in A/P where applicable  DG Chest Port 1 View  Result Date: 05/08/2020 CLINICAL DATA:  Increasing weakness EXAM: PORTABLE CHEST 1 VIEW COMPARISON:  02/19/2013 FINDINGS: Cardiac shadow is at the upper limits of normal in size. Lungs are well aerated bilaterally. Mild left basilar atelectatic changes are seen with small effusion. No bony abnormality is noted. IMPRESSION: Mild left basilar atelectatic changes with associated effusion. Electronically Signed   By: Inez Catalina M.D.   On: 05/08/2020 02:55    EKG: Independently reviewed.  NSR with rate 90; nonspecific T wave changes in the anterolateral leads  Labs on Admission: I have personally reviewed the available labs and imaging studies at the time of the admission.  Pertinent labs:   K+ 3.3 Glucose 125 Albumin 2.8 Bili 2.2 Lactate 1.2 x 2 WBC 19.2 Hgb 15.8 INR 1.3 UA: small Hgb, moderate LE, 100 protein, many bacteria, >50 RBC and WBC   Assessment/Plan Principal Problem:   Acute encephalopathy Active Problems:   Hypothyroidism   Hyperlipidemia   Essential hypertension, benign   Severe dementia ([place])   AMS with severe baseline dementia -Patient presenting with encephalopathy as evidenced by her increased sedation, lack of interaction, generalized weakness as described by her husband -While the patient does have  underlying severe dementia, this is a change compared to her usual baseline mental status  -Evaluation thus far unremarkable other than clearly advanced dementia as well as possible right-sided neglect -Her acute metabolic encephalopathy may be due to a UTI at this time based on abnormal UA; urine culture is pending. -CVA is also a consideration, although given her advanced dementia she is not a candidate for intervention; will order head CT -Suspect medication misadventure, likely associated with Xanax and/or Abilify -If neither of the above is the cause, this is likely a decline with transition to terminal dementia -Her husband met with hospice yesterday and reports that she is not a candidate at this time; based on current evaluation this is suspect.  Will request palliative care assistance. -Given her lack of engagement, her swallow function is of concern; will make NPO and request speech therapy cognitive and speech evaluations -Hold Aricept, Celexa, Ativan for now given concern for swallow dysfunction -Will observe for now with IVF hydration and telemetry monitoring -50% of patients with delirium while hospitalized will be institutionalized at 6 months, and these patients have a 25% mortality at 6 months -The family would benefit from being referred to the [facility] and also provided with the [employer] website -Will place unrestricted visitor status so that her husband and daughter can change out (he has active leukemia).  HTN -Hold Norvasc and cover with prn IV hydralazine  Hypothyroidism -Check TSH - she was oversuppressed on all prior TSH tests since 2019 -Hold Synthroid for now while NPO -Consider IV Synthroid pending clinical prognosis    Note: This patient has been tested and is negative for the novel coronavirus COVID-19. The patient has been fully vaccinated against COVID-19.  She was given her booster this AM while hospitalized.    DVT prophylaxis:  Lovenox   Code Status:  DNR - confirmed with family Family Communication: Husband was present throughout evaluation  Disposition Plan:  The patient is from: home  Anticipated d/c is to: home with hospice  Anticipated d/c date will depend on clinical response to treatment, but possibly as early as tomorrow if she has excellent response to treatment  Patient is currently: acutely ill Consults called: Palliative care; ST Admission status:  It is my clinical opinion that referral for OBSERVATION is reasonable and necessary in this patient based on the above information provided. The aforementioned taken together are felt to place the patient at high risk for further clinical deterioration. However it is anticipated that the patient may be medically stable for discharge from the hospital within 24 to 48 hours.      MD [facility]   How to contact the [facility] Attending or Consulting provider 7A - 7P or covering provider during after hours 7P -7A, for this patient?  1. Check the care team in [facility] and look for a) attending/consulting [facility] provider listed and b) the [facility] team listed 2. Log into [email or web address] and use [facility]'s universal password to access. If you do not have the password, please contact the hospital operator. 3. Locate the [facility] provider you are looking for under [facility] and page to a number that you can be directly reached. 4. If you still have difficulty reaching the provider, please page the DOC (Director on Call) for the Hospitalists listed on amion for assistance.   05/08/2020, 1:19 PM   

## 2020-05-08 NOTE — Telephone Encounter (Signed)
[facility] Night - Client TELEPHONE ADVICE RECORD Access[facility]urse Patient Name: Gloria Lloyd Gender: Female DOB: 04/03/1941 Age: 79 Y 30 M 2 D Return Phone Number: [phone] (Primary) Address: City/State/Zip: [place] [address] Client [facility] Night - Client Client Site [place] Physician Eliezer Lofts - MD Contact Type Call Who Is Calling Patient / Member / Family / Caregiver Call Type Triage / Clinical Caller Name rusty Florio Relationship To Patient Spouse Return Phone Number [phone] (Primary) Chief Complaint DIFFICULTY TO AWAKEN in anyone Reason for Call Symptomatic / Request for [facility] states mom has alzheimers aggressive stage and wont get out of bed past few days. Wants to sleep a lot and not really responding well. Translation No Nurse Assessment Nurse: Donna Christen, RN, Legrand Como Date/Time Eilene Ghazi Time): 05/06/2020 4:28:13 PM Confirm and document reason for call. If symptomatic, describe symptoms. ---Caller states mom has Alzheimer's aggressive stage and wont get out of bed past few days. Wants to sleep a lot and not really responding well. Unable to walk and is sleeping most of the time. Does the patient have any new or worsening symptoms? ---Yes Will a triage be completed? ---Yes Related visit to physician within the last 2 weeks? ---No Does the PT have any chronic conditions? (i.e. diabetes, asthma, this includes High risk factors for pregnancy, etc.) ---Yes List chronic conditions. ---Alzheimer's, HTN Is this a behavioral health or substance abuse call? ---No Guidelines Guideline Title Affirmed Question Affirmed Notes Nurse Date/Time Eilene Ghazi Time) Hospice - Death Imminent or Dying Symptoms Patient or caregiver has URGENT question and triager unable to answer question Darleen Crocker 05/06/2020 4:29:54 PM Disp. Time Eilene Ghazi Time)  Disposition Final User 05/06/2020 4:24:26 PM Send to Urgent Queue Shirlee Latch 05/06/2020 4:34:17 PM Called On-Call Provider Donna Christen, RN, Legrand Como 05/06/2020 4:31:47 PM See Hospice Nurse Within 4 Hours Yes Donna Christen, RN, Legrand Como PLEASE NOTE: All timestamps contained within this report are represented as [place] Standard Time. CONFIDENTIALTY NOTICE: This fax transmission is intended only for the addressee. It contains information that is legally privileged, confidential or otherwise protected from use or disclosure. If you are not the intended recipient, you are strictly prohibited from reviewing, disclosing, copying using or disseminating any of this information or taking any action in reliance on or regarding this information. If you have received this fax in error, please notify us immediately by telephone so that we can arrange for its return to [place]. Phone: [phone], Toll-Free: [phone], Fax: [phone] Page: 2 of 2 Call Id: [id number] [place] Disagree/Comply Disagree Caller Understands Yes PreDisposition InappropriateToAsk Care Advice Given Per Guideline * Based on what you have told me, I think that a visit by a hospice nurse may be needed within the next 4 hours. HOSPICE NURSE VISIT (OR HOSPICE NURSE FOLLOW-UP) WITHIN 4 HOURS: CALL BACK IF: CARE ADVICE given per Hospice - Death Imminent or Dying Symptoms (Adult) guideline. * You have more questions * Your loved one (patient) become worse * Your loved one passes on (dies) Comments User: Tresa Moore, RN Date/Time (Eastern Time): 05/06/2020 4:36:30 PM Family states that they do not want to take her to the hospital. Per Dr. Lorelei Pont should follow up with office on Monday for Hospice. User: Tresa Moore, RN Date/Time Eilene Ghazi Time): 05/06/2020 4:38:41 PM Spoke with caller and informed him that MD would send message to her PCP for hospice. Urged him to callback with further questions or worsening symptoms. verbalized  understanding. Referrals  GO TO FACILITY REFUSED Paging DoctorName Phone DateTime Result/Outcome Message Type Notes Lamar Blinks MD [phone] 05/06/2020 4:34:17 PM Called On Call Provider - Reached Doctor Paged Lamar Blinks- MD 05/06/2020 4:35:49 PM Spoke with On Call - General Message Result Let them know that if they do not want to take her in to be hospitalized should follow up with Dr. Diona Browner on Monday. If anything changes in the meantime have them call back. Will send message to PCP to get Hospice process started.

## 2020-05-08 NOTE — ED Provider Notes (Signed)
[facility] Provider Note   CSN: [id number] Arrival date & time: 05/08/20  0157    History Chief Complaint  Patient presents with  . Altered Mental Status  . Weakness    Gloria Lloyd is a 79 y.o. female.  The history is provided by the EMS personnel and a relative. The history is limited by the condition of the patient (Dementia).  Altered Mental Status Associated symptoms: weakness   Weakness She has history of hypertension, hyperlipidemia, dementia and is brought in by ambulance because of worsening mental status over the last 2 days.  She has not had any known fever and has not had any cough or vomiting or diarrhea.  Family has noted that she has been generally weak.  She is normally able to ambulate, but has not been able to do so for the last 3 days.  Past Medical History:  Diagnosis Date  . Adenomatous polyp of colon 04/2005  . Dementia ([place])   . Diverticulosis   . Erosive esophagitis   . Gout   . Hiatal hernia   . Hyperlipidemia   . Hypertension   . Thyroid disease     Patient Active Problem List   Diagnosis Date Noted  . Altered mental status 01/13/2020  . Agitation 12/25/2018  . Advanced care planning/counseling discussion 05/23/2015  . Severe dementia ([place]) 08/30/2014  . Stopped smoking with greater than 40 pack year history 02/19/2013  . Screening examination for venereal disease 01/04/2013  . Osteopenia 03/08/2011  . VAGINITIS, ATROPHIC 04/13/2010  . GERD 03/29/2010  . PERSONAL HX COLONIC POLYPS 03/29/2010  . UNSPECIFIED ARTHROPATHY MULTIPLE SITES 05/31/2009  . Gout 12/20/2008  . POLYCYTHEMIA 08/25/2008  . TOBACCO USE, QUIT 08/15/2008  . HYPERCALCEMIA 07/01/2007  . HERPES LABIALIS 04/06/2007  . Hypothyroidism 04/06/2007  . Hyperlipidemia 04/06/2007  . DISORDER, CALCIUM METABOLISM NOS 04/06/2007  . Essential hypertension, benign 04/06/2007    Past Surgical History:  Procedure Laterality Date  .  THYROIDECTOMY, PARTIAL    . TONSILLECTOMY       OB History   No obstetric history on file.     Family History  Problem Relation Age of Onset  . Diabetes Mother   . Hyperlipidemia Mother   . Hypertension Mother   . Hyperthyroidism Mother   . Coronary artery disease Maternal Grandmother     Social History   Tobacco Use  . Smoking status: Former Research scientist (life sciences)  . Smokeless tobacco: Never Used  Vaping Use  . Vaping Use: Never used  Substance Use Topics  . Alcohol use: Yes    Alcohol/week: 0.0 standard drinks    Comment: occasional  . Drug use: No    Home Medications Prior to Admission medications   Medication Sig Start Date End Date Taking? Authorizing Provider  amLODipine (NORVASC) 5 MG tablet Take 1 tablet (5 mg total) by mouth daily. 08/16/19   Bedsole, Amy E, MD  Cholecalciferol (VITAMIN D3 PO) Take 1 capsule by mouth daily.    [provider]  citalopram (CELEXA) 20 MG tablet TAKE 1 TABLET(20 MG) BY MOUTH AT BEDTIME 01/07/20   Bedsole, Amy E, MD  donepezil (ARICEPT) 10 MG tablet TAKE 1 TABLET BY MOUTH AT BEDTIME 09/16/19   Bedsole, Amy E, MD  levothyroxine (SYNTHROID) 100 MCG tablet TAKE 1 TABLET(100 MCG) BY MOUTH DAILY 09/13/19   Bedsole, Amy E, MD  LORazepam (ATIVAN) 0.5 MG tablet TAKE 1 TABLET(0.5 MG) BY MOUTH EVERY 8 HOURS AS NEEDED FOR ANXIETY 04/13/20  Bedsole, Amy E, MD  nitrofurantoin, macrocrystal-monohydrate, (MACROBID) 100 MG capsule Take 1 capsule (100 mg total) by mouth 2 (two) times daily. 11/12/19   Jinny Sanders, MD    Allergies    Penicillins and Sulfonamide derivatives  Review of Systems   Review of Systems  Unable to perform ROS: Dementia  Neurological: Positive for weakness.    Physical Exam Updated Vital Signs BP (!) 130/99 (BP Location: Left Arm)   Pulse 90   Resp 13   SpO2 92%   Physical Exam Vitals and nursing note reviewed.   79 year old female, resting comfortably and in no acute distress. Vital signs are significant for elevated  blood pressure. Oxygen saturation is 92%, which is normal. Head is normocephalic and atraumatic. PERRLA, EOMI. Oropharynx is clear. Neck is nontender and supple without adenopathy or JVD. Back is nontender and there is no CVA tenderness. Lungs are clear without rales, wheezes, or rhonchi. Chest is nontender. Heart has regular rate and rhythm without murmur. Abdomen is soft, flat, nontender without masses or hepatosplenomegaly and peristalsis is normoactive. Extremities have no cyanosis or edema, full range of motion is present. Skin is warm and dry without rash. Neurologic: Awake and alert but oriented x0, cranial nerves are intact.  She moves all extremities equally.  ED Results / Procedures / Treatments   Labs (all labs ordered are listed, but only abnormal results are displayed) Labs Reviewed  COMPREHENSIVE METABOLIC PANEL - Abnormal; Notable for the following components:      Result Value   Potassium 3.3 (*)    Glucose, Bld 125 (*)    Calcium 8.8 (*)    Albumin 2.8 (*)    Total Bilirubin 2.2 (*)    All other components within normal limits  CBC WITH DIFFERENTIAL/PLATELET - Abnormal; Notable for the following components:   WBC 19.2 (*)    RBC 5.36 (*)    Hemoglobin 15.8 (*)    HCT 48.4 (*)    Neutro Abs 15.6 (*)    Monocytes Absolute 1.6 (*)    Abs Immature Granulocytes 0.12 (*)    All other components within normal limits  PROTIME-INR - Abnormal; Notable for the following components:   INR 1.3 (*)    All other components within normal limits  URINALYSIS, ROUTINE W REFLEX MICROSCOPIC - Abnormal; Notable for the following components:   Color, Urine STRAW (*)    APPearance TURBID (*)    Hgb urine dipstick SMALL (*)    Protein, ur 100 (*)    Leukocytes,Ua MODERATE (*)    RBC / HPF >50 (*)    WBC, UA >50 (*)    Bacteria, UA MANY (*)    All other components within normal limits  CULTURE, BLOOD (SINGLE)  URINE CULTURE  RESP PANEL BY RT-PCR (FLU A&B, COVID) ARPGX2  LACTIC  ACID, PLASMA  LACTIC ACID, PLASMA  APTT    EKG EKG Interpretation  Date/Time:  Monday May 08 2020 02:12:52 EST Ventricular Rate:  90 PR Interval:    QRS Duration: 84 QT Interval:  401 QTC Calculation: 491 R Axis:   14 Text Interpretation: Sinus rhythm Low voltage, precordial leads Abnrm T, consider ischemia, anterolateral lds When compared with ECG of 07/20/2007, HEART RATE has increased T wave abnormality is now present Confirmed by Delora Fuel ([id number]) on 05/08/2020 2:15:10 AM   Radiology DG Chest Port 1 View  Result Date: 05/08/2020 CLINICAL DATA:  Increasing weakness EXAM: PORTABLE CHEST 1 VIEW COMPARISON:  02/19/2013 FINDINGS: Cardiac  shadow is at the upper limits of normal in size. Lungs are well aerated bilaterally. Mild left basilar atelectatic changes are seen with small effusion. No bony abnormality is noted. IMPRESSION: Mild left basilar atelectatic changes with associated effusion. Electronically Signed   By: Inez Catalina M.D.   On: 05/08/2020 02:55    Procedures Procedures  Medications Ordered in ED Medications  lactated ringers infusion ( Intravenous New Bag/Given 05/08/20 0250)  cefTRIAXone (ROCEPHIN) 2 g in sodium chloride 0.9 % 100 mL IVPB (2 g Intravenous New Bag/Given 05/08/20 0634)  potassium chloride SA (KLOR-CON) CR tablet 40 mEq (0 mEq Oral Return to [facility] 05/08/20 0525)  potassium chloride (KLOR-CON) packet 40 mEq (40 mEq Oral Given 05/08/20 0520)    ED Course  I have reviewed the triage vital signs and the nursing notes.  Pertinent labs & imaging results that were available during my care of the patient were reviewed by me and considered in my medical decision making (see chart for details).  MDM Rules/Calculators/A&P Altered mental status, possible sepsis.  EMS noted patient felt warm and was tachycardic.  Heart rate is normal and temperature is normal here but will check screening labs.  Old records are reviewed, and she has no relevant past  visits.  Chest x-ray shows no evidence of pneumonia.  Labs showed mild hypokalemia and she is given oral potassium.  Bilirubin is also noted to be elevated, the cause is unclear.  WBC is significantly elevated with left shift.  Lactic acid level is normal.  Analysis shows clear evidence of infection with greater than 50 WBCs and WBC clumps and many bacteria.  She is started on ceftriaxone.  Case discussed with Dr. Hal Hope of [facility], who agrees to admit the patient.  Final Clinical Impression(s) / ED Diagnoses Final diagnoses:  Urinary tract infection without hematuria, site unspecified  Generalized weakness  Confusion  Hypokalemia  Serum total bilirubin elevated    Rx / DC Orders ED Discharge Orders    None       Delora Fuel, MD 43/32/95 [phone]

## 2020-05-08 NOTE — ED Notes (Signed)
Patient returned from CT scan.

## 2020-05-08 NOTE — Telephone Encounter (Signed)
Patients husband called stating Patient is in the hospital to be admitted today. Has UTI

## 2020-05-09 ENCOUNTER — Ambulatory Visit: Payer: Medicare Other | Admitting: Family Medicine

## 2020-05-09 DIAGNOSIS — K5790 Diverticulosis of intestine, part unspecified, without perforation or abscess without bleeding: Secondary | ICD-10-CM | POA: Diagnosis present

## 2020-05-09 DIAGNOSIS — Z833 Family history of diabetes mellitus: Secondary | ICD-10-CM | POA: Diagnosis not present

## 2020-05-09 DIAGNOSIS — Z79899 Other long term (current) drug therapy: Secondary | ICD-10-CM | POA: Diagnosis not present

## 2020-05-09 DIAGNOSIS — K2210 Ulcer of esophagus without bleeding: Secondary | ICD-10-CM | POA: Diagnosis present

## 2020-05-09 DIAGNOSIS — Z8249 Family history of ischemic heart disease and other diseases of the circulatory system: Secondary | ICD-10-CM | POA: Diagnosis not present

## 2020-05-09 DIAGNOSIS — Z86010 Personal history of colonic polyps: Secondary | ICD-10-CM | POA: Diagnosis not present

## 2020-05-09 DIAGNOSIS — I10 Essential (primary) hypertension: Secondary | ICD-10-CM | POA: Diagnosis present

## 2020-05-09 DIAGNOSIS — Z7189 Other specified counseling: Secondary | ICD-10-CM | POA: Diagnosis not present

## 2020-05-09 DIAGNOSIS — R54 Age-related physical debility: Secondary | ICD-10-CM | POA: Diagnosis present

## 2020-05-09 DIAGNOSIS — E890 Postprocedural hypothyroidism: Secondary | ICD-10-CM | POA: Diagnosis present

## 2020-05-09 DIAGNOSIS — Z880 Allergy status to penicillin: Secondary | ICD-10-CM | POA: Diagnosis not present

## 2020-05-09 DIAGNOSIS — Z882 Allergy status to sulfonamides status: Secondary | ICD-10-CM | POA: Diagnosis not present

## 2020-05-09 DIAGNOSIS — Z79890 Hormone replacement therapy (postmenopausal): Secondary | ICD-10-CM | POA: Diagnosis not present

## 2020-05-09 DIAGNOSIS — N390 Urinary tract infection, site not specified: Secondary | ICD-10-CM | POA: Diagnosis not present

## 2020-05-09 DIAGNOSIS — Z83438 Family history of other disorder of lipoprotein metabolism and other lipidemia: Secondary | ICD-10-CM | POA: Diagnosis not present

## 2020-05-09 DIAGNOSIS — Z66 Do not resuscitate: Secondary | ICD-10-CM | POA: Diagnosis present

## 2020-05-09 DIAGNOSIS — G9340 Encephalopathy, unspecified: Secondary | ICD-10-CM | POA: Diagnosis not present

## 2020-05-09 DIAGNOSIS — Z87891 Personal history of nicotine dependence: Secondary | ICD-10-CM | POA: Diagnosis not present

## 2020-05-09 DIAGNOSIS — Z23 Encounter for immunization: Secondary | ICD-10-CM | POA: Diagnosis present

## 2020-05-09 DIAGNOSIS — M109 Gout, unspecified: Secondary | ICD-10-CM | POA: Diagnosis present

## 2020-05-09 DIAGNOSIS — R627 Adult failure to thrive: Secondary | ICD-10-CM | POA: Diagnosis present

## 2020-05-09 DIAGNOSIS — F0390 Unspecified dementia without behavioral disturbance: Secondary | ICD-10-CM | POA: Diagnosis present

## 2020-05-09 DIAGNOSIS — Z20822 Contact with and (suspected) exposure to covid-19: Secondary | ICD-10-CM | POA: Diagnosis present

## 2020-05-09 DIAGNOSIS — E876 Hypokalemia: Secondary | ICD-10-CM | POA: Diagnosis present

## 2020-05-09 DIAGNOSIS — G929 Unspecified toxic encephalopathy: Secondary | ICD-10-CM | POA: Diagnosis present

## 2020-05-09 DIAGNOSIS — E785 Hyperlipidemia, unspecified: Secondary | ICD-10-CM | POA: Diagnosis present

## 2020-05-09 DIAGNOSIS — R531 Weakness: Secondary | ICD-10-CM | POA: Diagnosis not present

## 2020-05-09 DIAGNOSIS — Z515 Encounter for palliative care: Secondary | ICD-10-CM | POA: Diagnosis not present

## 2020-05-09 LAB — CBC
HCT: 41.9 % (ref 36.0–46.0)
Hemoglobin: 13.7 g/dL (ref 12.0–15.0)
MCH: 29.4 pg (ref 26.0–34.0)
MCHC: 32.7 g/dL (ref 30.0–36.0)
MCV: 89.9 fL (ref 80.0–100.0)
Platelets: 261 K/uL (ref 150–400)
RBC: 4.66 MIL/uL (ref 3.87–5.11)
RDW: 12.3 % (ref 11.5–15.5)
WBC: 15.3 K/uL — ABNORMAL HIGH (ref 4.0–10.5)
nRBC: 0 % (ref 0.0–0.2)

## 2020-05-09 LAB — BASIC METABOLIC PANEL WITH GFR
Anion gap: 9 (ref 5–15)
BUN: 12 mg/dL (ref 8–23)
CO2: 27 mmol/L (ref 22–32)
Calcium: 8.5 mg/dL — ABNORMAL LOW (ref 8.9–10.3)
Chloride: 98 mmol/L (ref 98–111)
Creatinine, Ser: 0.63 mg/dL (ref 0.44–1.00)
GFR, Estimated: >60 mL/min
Glucose, Bld: 123 mg/dL — ABNORMAL HIGH (ref 70–99)
Potassium: 2.9 mmol/L — ABNORMAL LOW (ref 3.5–5.1)
Sodium: 134 mmol/L — ABNORMAL LOW (ref 135–145)

## 2020-05-09 MED ORDER — POTASSIUM CHLORIDE 10 MEQ/100ML IV SOLN
10.0000 meq | INTRAVENOUS | Status: AC
  Administered 2020-05-09 (×4): 10 meq via INTRAVENOUS
  Filled 2020-05-09 (×4): qty 100

## 2020-05-09 MED ORDER — LEVOTHYROXINE SODIUM 100 MCG/5ML IV SOLN: 50.0000 ug | Freq: Every day | INTRAVENOUS | Status: DC

## 2020-05-09 MED ORDER — SODIUM CHLORIDE 0.9 % IV SOLN
1.0000 g | Freq: Once | INTRAVENOUS | Status: AC
  Administered 2020-05-09: 1 g via INTRAVENOUS
  Filled 2020-05-09: qty 10

## 2020-05-09 MED ORDER — SODIUM CHLORIDE 0.9 % IV SOLN
1.0000 g | INTRAVENOUS | Status: DC
  Administered 2020-05-10 – 2020-05-11 (×2): 1 g via INTRAVENOUS
  Filled 2020-05-09: qty 1
  Filled 2020-05-09: qty 10

## 2020-05-09 MED ORDER — POTASSIUM CHLORIDE IN NACL 40-0.9 MEQ/L-% IV SOLN
INTRAVENOUS | Status: DC
  Filled 2020-05-09 (×5): qty 1000

## 2020-05-09 NOTE — Progress Notes (Signed)
Daily Progress Note   Patient Name: Gloria Lloyd       Date: 05/09/2020 DOB: 1941-06-07  Age: 79 y.o. MRN#: [id number] Attending Physician: Domenic Polite, MD Primary Care Physician: Jinny Sanders, MD Admit Date: 05/08/2020  Reason for Follow-up: continued GOC discussion  Subjective: Daughter tracy is at bedside. She states patient has been sleeping and "out of it" for most of the day. Also has not eaten all day.   We discuss that her current condition (and significant decline over the last few days) was likely encephalopathy from presumed UTI. This would hopefully improve as the infection improves, but it is likely she will not return fully to her previous baseline.   We discussed that progression of the dementia could also be contributing to her current condition and recent decline. Tracy verbalizes understanding. They are hopeful for improvement, but also trying to prepare for the possibility of continued decline.   Length of Stay: 0  Current Medications: Scheduled Meds:  . enoxaparin (LOVENOX) injection  40 mg Subcutaneous Q24H  . influenza vaccine adjuvanted  0.5 mL Intramuscular Tomorrow-1000  . [START ON 05/12/2020] levothyroxine  50 mcg Intravenous Q0600  . LORazepam  1 mg Intravenous Once  . sodium chloride flush  3 mL Intravenous Q12H    Continuous Infusions: . 0.9 % NaCl with KCl 40 mEq / L 75 mL/hr at 05/09/20 0913  . [START ON 05/10/2020] cefTRIAXone (ROCEPHIN)  IV    . potassium chloride 10 mEq (05/09/20 1703)    PRN Meds: acetaminophen, albuterol, hydrALAZINE, [DISCONTINUED] ondansetron **OR** ondansetron (ZOFRAN) IV  Physical Exam Vitals reviewed.  Constitutional:      General: She is not in acute distress.    Appearance: She is ill-appearing.  HENT:      Head: Normocephalic and atraumatic.  Pulmonary:     Effort: Pulmonary effort is normal.  Neurological:     Mental Status: She is lethargic and confused.     Comments: Awakens to voice             Vital Signs: BP 137/80 (BP Location: Left Arm)   Pulse 76   Temp 98.7 F (37.1 C) (Oral)   Resp 17   Ht 5\' 6"  (1.676 m)   Wt 64.2 kg   SpO2 90%   BMI 22.84 kg/m  SpO2:  SpO2: 90 % O2 Device: O2 Device: Nasal Cannula O2 Flow Rate: O2 Flow Rate (L/min): 1 L/min  Intake/output summary:   Intake/Output Summary (Last 24 hours) at 05/09/2020 1736 Last data filed at 05/09/2020 1300 Gross per 24 hour  Intake 1874.52 ml  Output 300 ml  Net 1574.52 ml   LBM: Last BM Date:  (PTA) Baseline Weight: Weight: 63.5 kg Most recent weight: Weight: 64.2 kg       Palliative Assessment/Data: PPS 20%      Palliative Care Assessment & Plan   HPI/Patient Profile: 79 y.o. female  with past medical history of dementia, hypertension, and hyperlipidemia presenting to the emergency department on 05/08/2020 with AMS. Family noticed her to be less responsive about 3 days prior, and they were unable to get her out of bed.  In the ED, urinalysis is concerning for UTI and she was treated with ceftriaxone. Admitted to [facility] for management of acute metabolic encephalopathy.   Assessment: - acute encephalopathy in the setting of advanced dementia - possible UTI  Recommendations/Plan: - DNR/DNI as previously documented - continue current medical care - goal of care is for patient to be at home - family is interested in hospice services at home - [facility] order placed - PMT will continue to follow  Goals of Care and Additional Recommendations:  No aggressive medical interventions  Prognosis:   less than 6 months  Discharge Planning:  To be determined  - goal is Home, possibly with hospice     Thank you for allowing the Palliative Medicine Team to assist in the care of this patient.   Total Time  25 minutes Prolonged Time Billed  no       Greater than 50%  of this time was spent counseling and coordinating care related to the above assessment and plan.  Lavena Bullion, NP  Please contact Palliative Medicine Team phone at [phone] for questions and concerns.

## 2020-05-09 NOTE — Progress Notes (Addendum)
PROGRESS NOTE    Gloria Lloyd  IRC:[id number] DOB: Jun 05, 1941 DOA: 05/08/2020 PCP: Jinny Sanders, MD  Brief Narrative: Gloria Lloyd is an 79 year old female with history of advanced dementia, hypertension, dyslipidemia, hypothyroidism admitted 11/29 with worsening lethargy.  Patient is mostly confused at baseline and requires assistance in most ADLs, is able to walk few steps to the bathroom with support, wears depends, incontinent. -Family reported worsening lethargy for 3 to 4 days following Thanksgiving. -Work-up in the ED noted leukocytosis and abnormal urinalysis   Assessment & Plan:   Acute toxic encephalopathy Baseline advanced dementia -No fevers or chills however in the setting of leukocytosis and abnormal urinalysis agree with treating UTI, continue ceftriaxone, follow-up urine culture -Head CT notes atrophy -Remains lethargic this morning -Suspect early 3 AM dose of morphine and Ativan could be contributing to lethargy this morning as well , holding Aricept Celexa and Ativan at this time -Just prior to admission spouse met with palliative care/hospice -Continue IV fluids and antibiotics today monitor response to treatment -Palliative care consulted for goals of care, plan for family meeting later today  Hypokalemia -Replace  Hypertension -Amlodipine on hold  Hypothyroidism -Synthroid normal yesterday, will resume IV for now  DVT prophylaxis: Lovenox Code Status: DNR Family Communication: Daughter at bedside Disposition Plan:  Status is: Observation  The patient will require care spanning > 2 midnights and should be moved to inpatient because: Inpatient level of care appropriate due to severity of illness  Dispo: The patient is from: Home              Anticipated d/c is to: Home              Anticipated d/c date is: 3 days              Patient currently is not medically stable to d/c.  Consultants:   Palliative medicine   Procedures:   Antimicrobials:      Subjective: -Lethargic this morning, got a dose of morphine and Ativan around 3 AM  Objective: Vitals:   05/08/20 2102 05/09/20 0018 05/09/20 0504 05/09/20 0921  BP: 127/89 136/81 130/80 137/80  Pulse: 92 89 85 76  Resp: _0 Temp: 98.5 F (36.9 C) 98.3 F (36.8 C) 98 F (36.7 C) 98.7 F (37.1 C)  TempSrc: Oral Axillary Oral Oral  SpO2: 93% 92% 92% 90%  Weight: 64.2 kg     Height:        Intake/Output Summary (Last 24 hours) at 05/09/2020 1356 Last data filed at 05/09/2020 0800 Gross per 24 hour  Intake 1874.52 ml  Output 300 ml  Net 1574.52 ml   Filed Weights   05/08/20 0251 05/08/20 2102  Weight: 63.5 kg 64.2 kg    Examination:  General exam: Chronically ill elderly female laying in bed, lethargic, arouses to verbal stimuli, goes back to sleep, no distress CVS: S1-S2, regular rate rhythm Lungs: Clear anteriorly Abdomen: Soft, nontender, bowel sounds present Extremities: No edema Neuro: Lethargic, does not follow commands, arouses to verbal and painful stimuli Psychiatry: Unable to assess    Data Reviewed:   CBC: Recent Labs  Lab 05/08/20 0230 05/09/20 0643  WBC 19.2* 15.3*  NEUTROABS 15.6*  --   HGB 15.8* 13.7  HCT 48.4* 41.9  MCV 90.3 89.9  PLT 251 510   Basic Metabolic Panel: Recent Labs  Lab 05/08/20 0230 05/09/20 0643  NA 136 134*  K 3.3* 2.9*  CL 100 98  CO2 25  27  GLUCOSE 125* 123*  BUN 16 12  CREATININE 0.78 0.63  CALCIUM 8.8* 8.5*   GFR: Estimated Creatinine Clearance: 52.5 mL/min (by C-G formula based on SCr of 0.63 mg/dL). Liver Function Tests: Recent Labs  Lab 05/08/20 0230  AST 19  ALT 9  ALKPHOS 47  BILITOT 2.2*  PROT 6.5  ALBUMIN 2.8*   No results for input(s): LIPASE, AMYLASE in the last 168 hours. No results for input(s): AMMONIA in the last 168 hours. Coagulation Profile: Recent Labs  Lab 05/08/20 0230  INR 1.3*   Cardiac Enzymes: No results for input(s): CKTOTAL, CKMB, CKMBINDEX, TROPONINI  in the last 168 hours. BNP (last 3 results) No results for input(s): PROBNP in the last 8760 hours. HbA1C: No results for input(s): HGBA1C in the last 72 hours. CBG: No results for input(s): GLUCAP in the last 168 hours. Lipid Profile: No results for input(s): CHOL, HDL, LDLCALC, TRIG, CHOLHDL, LDLDIRECT in the last 72 hours. Thyroid Function Tests: Recent Labs    05/08/20 1742  TSH 0.506   Anemia Panel: No results for input(s): VITAMINB12, FOLATE, FERRITIN, TIBC, IRON, RETICCTPCT in the last 72 hours. Urine analysis:    Component Value Date/Time   COLORURINE STRAW (A) 05/08/2020 0425   APPEARANCEUR TURBID (A) 05/08/2020 0425   LABSPEC 1.009 05/08/2020 0425   PHURINE 5.0 05/08/2020 0425   GLUCOSEU NEGATIVE 05/08/2020 0425   HGBUR SMALL (A) 05/08/2020 0425   HGBUR negative 04/13/2010 0829   BILIRUBINUR NEGATIVE 05/08/2020 0425   BILIRUBINUR Negative 12/25/2018 1546   KETONESUR NEGATIVE 05/08/2020 0425   PROTEINUR 100 (A) 05/08/2020 0425   UROBILINOGEN 0.2 12/25/2018 1546   UROBILINOGEN 0.2 04/13/2010 0829   NITRITE NEGATIVE 05/08/2020 0425   LEUKOCYTESUR MODERATE (A) 05/08/2020 0425   Sepsis Labs: _0 (procalcitonin:4,lacticidven:4)  ) Recent Results (from the past 240 hour(s))  Blood culture (routine single)     Status: None (Preliminary result)   Collection Time: 05/08/20  4:13 AM   Specimen: BLOOD  Result Value Ref Range Status   Specimen Description BLOOD SITE NOT SPECIFIED  Final   Special Requests   Final    BOTTLES DRAWN AEROBIC AND ANAEROBIC Blood Culture adequate volume   Culture   Final    NO GROWTH 1 DAY Performed at [facility], [place] [address] [place], [place] [address]    Report Status PENDING  Incomplete  Resp Panel by RT-PCR (Flu A&B, Covid) Nasopharyngeal Swab     Status: None   Collection Time: 05/08/20  6:35 AM   Specimen: Nasopharyngeal Swab; Nasopharyngeal(NP) swabs in vial transport medium  Result Value Ref Range Status   SARS  Coronavirus 2 by RT PCR NEGATIVE NEGATIVE Final    Comment: (NOTE) SARS-CoV-2 target nucleic acids are NOT DETECTED.  The SARS-CoV-2 RNA is generally detectable in upper respiratory specimens during the acute phase of infection. The lowest concentration of SARS-CoV-2 viral copies this assay can detect is 138 copies/mL. A negative result does not preclude SARS-Cov-2 infection and should not be used as the sole basis for treatment or other patient management decisions. A negative result may occur with  improper specimen collection/handling, submission of specimen other than nasopharyngeal swab, presence of viral mutation(s) within the areas targeted by this assay, and inadequate number of viral copies(<138 copies/mL). A negative result must be combined with clinical observations, patient history, and epidemiological information. The expected result is Negative.  Fact Sheet for Patients:  EntrepreneurPulse.com.au  Fact Sheet for Healthcare Providers:  IncredibleEmployment.be  This  test is no t yet approved or cleared by the [place] and  has been authorized for detection and/or diagnosis of SARS-CoV-2 by FDA under an Emergency Use Authorization (EUA). This EUA will remain  in effect (meaning this test can be used) for the duration of the COVID-19 declaration under Section 564(b)(1) of the Act, 21 U.S.C.section 360bbb-3(b)(1), unless the authorization is terminated  or revoked sooner.       Influenza A by PCR NEGATIVE NEGATIVE Final   Influenza B by PCR NEGATIVE NEGATIVE Final    Comment: (NOTE) The Xpert Xpress SARS-CoV-2/FLU/RSV plus assay is intended as an aid in the diagnosis of influenza from Nasopharyngeal swab specimens and should not be used as a sole basis for treatment. Nasal washings and aspirates are unacceptable for Xpert Xpress SARS-CoV-2/FLU/RSV testing.  Fact Sheet for  Patients: EntrepreneurPulse.com.au  Fact Sheet for Healthcare Providers: IncredibleEmployment.be  This test is not yet approved or cleared by the [place] FDA and has been authorized for detection and/or diagnosis of SARS-CoV-2 by FDA under an Emergency Use Authorization (EUA). This EUA will remain in effect (meaning this test can be used) for the duration of the COVID-19 declaration under Section 564(b)(1) of the Act, 21 U.S.C. section 360bbb-3(b)(1), unless the authorization is terminated or revoked.  Performed at [facility], [place] [address] [place], [place] [address]          Radiology Studies: CT HEAD WO CONTRAST  Result Date: 05/08/2020 CLINICAL DATA:  Delirium, altered mental status EXAM: CT HEAD WITHOUT CONTRAST TECHNIQUE: Contiguous axial images were obtained from the base of the skull through the vertex without intravenous contrast. COMPARISON:  MRI 02/14/2015 FINDINGS: Brain: No evidence of acute infarction, hemorrhage, hydrocephalus, extra-axial collection, visible mass lesion or mass effect. Diffuse prominence of the ventricles, cisterns and sulci compatible with parenchymal volume loss slightly more pronounced in the frontal lobes and right temporal lobe. Patchy areas of white matter hypoattenuation are most compatible with chronic microvascular angiopathy. Vascular: Atherosclerotic calcification of the carotid siphons and intradural vertebral arteries. No hyperdense vessel. Skull: Motion degradation is seen most pronounced towards the vertex. Prominent venous [place] versus remote burr hole of the right frontal bone. No calvarial fracture or suspicious osseous lesion. No scalp swelling or hematoma. Sinuses/Orbits: Paranasal sinuses and mastoid air cells are predominantly clear. Included orbital structures are unremarkable. Other: None. IMPRESSION: 1. Motion degradation is seen most pronounced towards the vertex. 2. No definite  acute intracranial abnormality. 3. Chronic microvascular angiopathy and parenchymal volume loss. Electronically Signed   By: Lovena Le M.D.   On: 05/08/2020 17:09   DG Chest Port 1 View  Result Date: 05/08/2020 CLINICAL DATA:  Increasing weakness EXAM: PORTABLE CHEST 1 VIEW COMPARISON:  02/19/2013 FINDINGS: Cardiac shadow is at the upper limits of normal in size. Lungs are well aerated bilaterally. Mild left basilar atelectatic changes are seen with small effusion. No bony abnormality is noted. IMPRESSION: Mild left basilar atelectatic changes with associated effusion. Electronically Signed   By: Inez Catalina M.D.   On: 05/08/2020 02:55   Scheduled Meds: . enoxaparin (LOVENOX) injection  40 mg Subcutaneous Q24H  . influenza vaccine adjuvanted  0.5 mL Intramuscular Tomorrow-1000  . LORazepam  1 mg Intravenous Once  . sodium chloride flush  3 mL Intravenous Q12H   Continuous Infusions: . 0.9 % NaCl with KCl 40 mEq / L 75 mL/hr at 05/09/20 0913  . [START ON 05/10/2020] cefTRIAXone (ROCEPHIN)  IV    . potassium  chloride       LOS: 0 days    Time spent: 8mn PDomenic Polite MD [facility]  05/09/2020, 1:56 PM

## 2020-05-10 DIAGNOSIS — G9340 Encephalopathy, unspecified: Secondary | ICD-10-CM | POA: Diagnosis not present

## 2020-05-10 DIAGNOSIS — Z515 Encounter for palliative care: Secondary | ICD-10-CM | POA: Diagnosis not present

## 2020-05-10 DIAGNOSIS — Z7189 Other specified counseling: Secondary | ICD-10-CM | POA: Diagnosis not present

## 2020-05-10 DIAGNOSIS — R531 Weakness: Secondary | ICD-10-CM | POA: Diagnosis not present

## 2020-05-10 DIAGNOSIS — F0390 Unspecified dementia without behavioral disturbance: Secondary | ICD-10-CM | POA: Diagnosis not present

## 2020-05-10 LAB — CBC
HCT: 40.9 % (ref 36.0–46.0)
Hemoglobin: 13.3 g/dL (ref 12.0–15.0)
MCH: 29.4 pg (ref 26.0–34.0)
MCHC: 32.5 g/dL (ref 30.0–36.0)
MCV: 90.3 fL (ref 80.0–100.0)
Platelets: 272 10*3/uL (ref 150–400)
RBC: 4.53 MIL/uL (ref 3.87–5.11)
RDW: 12.3 % (ref 11.5–15.5)
WBC: 13.8 10*3/uL — ABNORMAL HIGH (ref 4.0–10.5)
nRBC: 0 % (ref 0.0–0.2)

## 2020-05-10 LAB — BASIC METABOLIC PANEL
Anion gap: 11 (ref 5–15)
BUN: 8 mg/dL (ref 8–23)
CO2: 24 mmol/L (ref 22–32)
Calcium: 8.8 mg/dL — ABNORMAL LOW (ref 8.9–10.3)
Chloride: 103 mmol/L (ref 98–111)
Creatinine, Ser: 0.69 mg/dL (ref 0.44–1.00)
GFR, Estimated: >60 mL/min (ref >60)
Glucose, Bld: 97 mg/dL (ref 70–99)
Potassium: 4.5 mmol/L (ref 3.5–5.1)
Sodium: 138 mmol/L (ref 135–145)

## 2020-05-10 LAB — URINE CULTURE: Culture: NO GROWTH

## 2020-05-10 MED ORDER — LEVOTHYROXINE SODIUM 100 MCG PO TABS
100.0000 ug | ORAL_TABLET | Freq: Every day | ORAL | Status: DC
  Administered 2020-05-11: 100 ug via ORAL
  Filled 2020-05-10: qty 1

## 2020-05-10 NOTE — Progress Notes (Addendum)
PROGRESS NOTE    Gloria Lloyd  OLM:[id number] DOB: Dec 20, 1940 DOA: 05/08/2020 PCP: Jinny Sanders, MD  Brief Narrative: [name] is an 79 year old female with history of advanced dementia, hypertension, dyslipidemia, hypothyroidism admitted 11/29 with worsening lethargy.  Patient is mostly confused at baseline and requires assistance in most ADLs, is able to walk few steps to the bathroom with support, wears depends, incontinent. -Family reported worsening lethargy for 3 to 4 days following Thanksgiving. -Work-up in the ED noted leukocytosis and abnormal urinalysis   Assessment & Plan:   Acute toxic encephalopathy Baseline advanced dementia -No fevers or chills however in the setting of leukocytosis and abnormal urinalysis agree with treating UTI, continue ceftriaxone, follow-up urine culture which is negative to date. -Head CT notes atrophy.  Per daughter who is at the bedside, patient is more alert than yesterday but still not back to baseline.  Patient kept her eyes closed for most part during the long encounter that I had in her room today and a long chat then had with her family.  Hypokalemia Resolved.  Hypertension -Blood pressure very well controlled while amlodipine on hold  Hypothyroidism Now that she is eating, we will put her back on oral dose.  GOC/discussion about plan of care/discharge: Palliative care had seen the patient.  Husband believes that patient is not back to baseline but previously too, she was requiring 2 people help and he tells me that he is not able to do that anymore.  I offered discharging to SNF because she really looks weak but husband wants to take her home while at the same time, he tells me that he cannot take care of her by himself.  I did explain to him that home health will visit twice a week if we were to arrange that.  He was interested in hospice but he was under the impression that hospice will come every day and take care of the patient.  I  had a discussion with the patient where I explained about the idea of hospice which unfortunately has not been clarified to the family by palliative care.  After long discussion, husband and daughter decided to talk among themselves and come up with the plan that suits them and will let me know as soon as possible.  DVT prophylaxis: Lovenox Code Status: DNR Family Communication: Daughter and has been at bedside Disposition Plan:  Status is: Observation  The patient will require care spanning > 2 midnights and should be moved to inpatient because: Inpatient level of care appropriate due to severity of illness  Dispo: The patient is from: Home              Anticipated d/c is to: Home or SNF              Anticipated d/c date is: 1 day              Patient currently is not medically stable to d/c.  Consultants:   Palliative medicine   Procedures:   Antimicrobials:    Subjective: Patient seen and examined.  Daughter and husband at the bedside.  Patient more alert according to daughter but is still lethargic.  Kept her eyes closed.  Will not answer questions for most part.  But she was eating breakfast normally.  Objective: Vitals:   05/09/20 2053 05/09/20 2241 05/10/20 0624 05/10/20 1001  BP: 134/71  131/73 107/70  Pulse: 88  80 74  Resp: 16   17  Temp: 98.7 F (  37.1 C) 99.4 F (37.4 C) (!) 97.5 F (36.4 C) 98 F (36.7 C)  TempSrc: Axillary Axillary  Axillary  SpO2: 92%  (!) 84% 90%  Weight: 66.8 kg     Height:        Intake/Output Summary (Last 24 hours) at 05/10/2020 1247 Last data filed at 05/10/2020 0932 Gross per 24 hour  Intake 1221.25 ml  Output 600 ml  Net 621.25 ml   Filed Weights   05/08/20 0251 05/08/20 2102 05/09/20 2053  Weight: 63.5 kg 64.2 kg 66.8 kg    Examination:  General exam: Appears calm and comfortable but slightly lethargic Respiratory system: Clear to auscultation. Respiratory effort normal. Cardiovascular system: S1 & S2 heard, RRR. No  JVD, murmurs, rubs, gallops or clicks. No pedal edema. Gastrointestinal system: Abdomen is nondistended, soft and nontender. No organomegaly or masses felt. Normal bowel sounds heard. Central nervous system: Lethargic and unable to assess orientation as she would not talk much. No focal neurological deficits. Extremities: Symmetric 5 x 5 power. Skin: No rashes, lesions or ulcers.  Psychiatry: Judgement and insight appear poor.   Data Reviewed:   CBC: Recent Labs  Lab 05/08/20 0230 05/09/20 0643 05/10/20 0210  WBC 19.2* 15.3* 13.8*  NEUTROABS 15.6*  --   --   HGB 15.8* 13.7 13.3  HCT 48.4* 41.9 40.9  MCV 90.3 89.9 90.3  PLT 251 261 119   Basic Metabolic Panel: Recent Labs  Lab 05/08/20 0230 05/09/20 0643 05/10/20 0210  NA 136 134* 138  K 3.3* 2.9* 4.5  CL 100 98 103  CO2 25 27 24   GLUCOSE 125* 123* 97  BUN 16 12 8   CREATININE 0.78 0.63 0.69  CALCIUM 8.8* 8.5* 8.8*   GFR: Estimated Creatinine Clearance: 52.5 mL/min (by C-G formula based on SCr of 0.69 mg/dL). Liver Function Tests: Recent Labs  Lab 05/08/20 0230  AST 19  ALT 9  ALKPHOS 47  BILITOT 2.2*  PROT 6.5  ALBUMIN 2.8*   No results for input(s): LIPASE, AMYLASE in the last 168 hours. No results for input(s): AMMONIA in the last 168 hours. Coagulation Profile: Recent Labs  Lab 05/08/20 0230  INR 1.3*   Cardiac Enzymes: No results for input(s): CKTOTAL, CKMB, CKMBINDEX, TROPONINI in the last 168 hours. BNP (last 3 results) No results for input(s): PROBNP in the last 8760 hours. HbA1C: No results for input(s): HGBA1C in the last 72 hours. CBG: No results for input(s): GLUCAP in the last 168 hours. Lipid Profile: No results for input(s): CHOL, HDL, LDLCALC, TRIG, CHOLHDL, LDLDIRECT in the last 72 hours. Thyroid Function Tests: Recent Labs    05/08/20 1742  TSH 0.506   Anemia Panel: No results for input(s): VITAMINB12, FOLATE, FERRITIN, TIBC, IRON, RETICCTPCT in the last 72 hours. Urine  analysis:    Component Value Date/Time   COLORURINE STRAW (A) 05/08/2020 0425   APPEARANCEUR TURBID (A) 05/08/2020 0425   LABSPEC 1.009 05/08/2020 0425   PHURINE 5.0 05/08/2020 0425   GLUCOSEU NEGATIVE 05/08/2020 0425   HGBUR SMALL (A) 05/08/2020 0425   HGBUR negative 04/13/2010 0829   BILIRUBINUR NEGATIVE 05/08/2020 0425   BILIRUBINUR Negative 12/25/2018 1546   KETONESUR NEGATIVE 05/08/2020 0425   PROTEINUR 100 (A) 05/08/2020 0425   UROBILINOGEN 0.2 12/25/2018 1546   UROBILINOGEN 0.2 04/13/2010 0829   NITRITE NEGATIVE 05/08/2020 0425   LEUKOCYTESUR MODERATE (A) 05/08/2020 0425   Sepsis Labs: @LABRCNTIP (procalcitonin:4,lacticidven:4)  ) Recent Results (from the past 240 hour(s))  Blood culture (routine single)  Status: None (Preliminary result)   Collection Time: 05/08/20  4:13 AM   Specimen: BLOOD  Result Value Ref Range Status   Specimen Description BLOOD SITE NOT SPECIFIED  Final   Special Requests   Final    BOTTLES DRAWN AEROBIC AND ANAEROBIC Blood Culture adequate volume   Culture   Final    NO GROWTH 2 DAYS Performed at [facility], [address], [place], [place] [address]    Report Status PENDING  Incomplete  Resp Panel by RT-PCR (Flu A&B, Covid) Nasopharyngeal Swab     Status: None   Collection Time: 05/08/20  6:35 AM   Specimen: Nasopharyngeal Swab; Nasopharyngeal(NP) swabs in vial transport medium  Result Value Ref Range Status   SARS Coronavirus 2 by RT PCR NEGATIVE NEGATIVE Final    Comment: (NOTE) SARS-CoV-2 target nucleic acids are NOT DETECTED.  The SARS-CoV-2 RNA is generally detectable in upper respiratory specimens during the acute phase of infection. The lowest concentration of SARS-CoV-2 viral copies this assay can detect is 138 copies/mL. A negative result does not preclude SARS-Cov-2 infection and should not be used as the sole basis for treatment or other patient management decisions. A negative result may occur with  improper  specimen collection/handling, submission of specimen other than nasopharyngeal swab, presence of viral mutation(s) within the areas targeted by this assay, and inadequate number of viral copies(<138 copies/mL). A negative result must be combined with clinical observations, patient history, and epidemiological information. The expected result is Negative.  Fact Sheet for Patients:  EntrepreneurPulse.com.au  Fact Sheet for Healthcare Providers:  IncredibleEmployment.be  This test is no t yet approved or cleared by the [place] FDA and  has been authorized for detection and/or diagnosis of SARS-CoV-2 by FDA under an Emergency Use Authorization (EUA). This EUA will remain  in effect (meaning this test can be used) for the duration of the COVID-19 declaration under Section 564(b)(1) of the Act, 21 U.S.C.section 360bbb-3(b)(1), unless the authorization is terminated  or revoked sooner.       Influenza A by PCR NEGATIVE NEGATIVE Final   Influenza B by PCR NEGATIVE NEGATIVE Final    Comment: (NOTE) The Xpert Xpress SARS-CoV-2/FLU/RSV plus assay is intended as an aid in the diagnosis of influenza from Nasopharyngeal swab specimens and should not be used as a sole basis for treatment. Nasal washings and aspirates are unacceptable for Xpert Xpress SARS-CoV-2/FLU/RSV testing.  Fact Sheet for Patients: EntrepreneurPulse.com.au  Fact Sheet for Healthcare Providers: IncredibleEmployment.be  This test is not yet approved or cleared by the [place] FDA and has been authorized for detection and/or diagnosis of SARS-CoV-2 by FDA under an Emergency Use Authorization (EUA). This EUA will remain in effect (meaning this test can be used) for the duration of the COVID-19 declaration under Section 564(b)(1) of the Act, 21 U.S.C. section 360bbb-3(b)(1), unless the authorization is terminated or revoked.  Performed at  [facility], [place] [address], [place], [place] [address]   Urine culture     Status: None   Collection Time: 05/09/20  2:51 PM   Specimen: In/Out Cath Urine  Result Value Ref Range Status   Specimen Description IN/OUT CATH URINE  Final   Special Requests NONE  Final   Culture   Final    NO GROWTH Performed at [facility], [place] [address] [place], [place] [address]    Report Status 05/10/2020 FINAL  Final         Radiology Studies:  CT HEAD WO CONTRAST  Result Date: 05/08/2020 CLINICAL DATA:  Delirium, altered mental status EXAM: CT HEAD WITHOUT CONTRAST TECHNIQUE: Contiguous axial images were obtained from the base of the skull through the vertex without intravenous contrast. COMPARISON:  MRI 02/14/2015 FINDINGS: Brain: No evidence of acute infarction, hemorrhage, hydrocephalus, extra-axial collection, visible mass lesion or mass effect. Diffuse prominence of the ventricles, cisterns and sulci compatible with parenchymal volume loss slightly more pronounced in the frontal lobes and right temporal lobe. Patchy areas of white matter hypoattenuation are most compatible with chronic microvascular angiopathy. Vascular: Atherosclerotic calcification of the carotid siphons and intradural vertebral arteries. No hyperdense vessel. Skull: Motion degradation is seen most pronounced towards the vertex. Prominent venous [place] versus remote burr hole of the right frontal bone. No calvarial fracture or suspicious osseous lesion. No scalp swelling or hematoma. Sinuses/Orbits: Paranasal sinuses and mastoid air cells are predominantly clear. Included orbital structures are unremarkable. Other: None. IMPRESSION: 1. Motion degradation is seen most pronounced towards the vertex. 2. No definite acute intracranial abnormality. 3. Chronic microvascular angiopathy and parenchymal volume loss. Electronically Signed   By: Lovena Le M.D.   On: 05/08/2020 17:09   Scheduled Meds: . enoxaparin (LOVENOX)  injection  40 mg Subcutaneous Q24H  . influenza vaccine adjuvanted  0.5 mL Intramuscular Tomorrow-1000  . [START ON 05/12/2020] levothyroxine  50 mcg Intravenous Q0600  . LORazepam  1 mg Intravenous Once  . sodium chloride flush  3 mL Intravenous Q12H   Continuous Infusions: . 0.9 % NaCl with KCl 40 mEq / L 75 mL/hr at 05/09/20 2231  . cefTRIAXone (ROCEPHIN)  IV 1 g (05/10/20 0932)     LOS: 1 day    Time spent: 40 min Darliss Cheney, MD [facility]  05/10/2020, 12:47 PM

## 2020-05-10 NOTE — Progress Notes (Signed)
Daily Progress Note   Patient Name: Gloria Lloyd       Date: 05/10/2020 DOB: 06-02-1941  Age: 79 y.o. MRN#: [id number] Attending Physician: Darliss Cheney, MD Primary Care Physician: Jinny Sanders, MD Admit Date: 05/08/2020  Reason for Follow-up: continued [place] discussions, disposition  Subjective: PMT received a call from daughter Olivia Mackie - she is concerned that her mother may be discharged today. They family is panicking about being able to provide care for her at home.   13:00--I met husband at bedside and provided reassurance that I would support him through this process. We discussed the patient's current medical condition in detail - she is medically stable but her functional status has not improved since admission. Discussed that her urine culture is negative, indicating that her current state is likely due to progression of her dementia more than acute encephalopathy as initially suspected. We also reviewed the trajectory of dementia and I expressed concern that she may be approaching end stage with her illness. Husband is tearful - emotional support provided.   We then discussed options for disposition. I confirmed that he does not want to pursue SNF/rehab. I explained that the other options are home with home health (PT/OT) or home with hospice. We discussed that she likely will not benefit from PT - he agrees. I explained that home hospice would focus more on comfort and quality of life. I offered to have a hospice liaison speak with him in more detail regarding the services they could provide, and he agrees this would be beneficial. Discussed choice of agency, and that he may want to consider potential need for residential hospice in the future. Based on where they live, husband would  prefer [facility].   17:10--I met again at bedside with husband and daughter. Husband spoke with the hospice liaison and feels comfortable with proceeding with hospice care at home.   Length of Stay: 1  Current Medications: Scheduled Meds:   enoxaparin (LOVENOX) injection  40 mg Subcutaneous Q24H   influenza vaccine adjuvanted  0.5 mL Intramuscular Tomorrow-1000   [START ON 05/11/2020] levothyroxine  100 mcg Oral Q0600   LORazepam  1 mg Intravenous Once   sodium chloride flush  3 mL Intravenous Q12H    Continuous Infusions:  0.9 % NaCl with KCl 40 mEq / L 75 mL/hr  at 05/09/20 2231   cefTRIAXone (ROCEPHIN)  IV 1 g (05/10/20 0932)    PRN Meds: acetaminophen, albuterol, hydrALAZINE, [DISCONTINUED] ondansetron **OR** ondansetron (ZOFRAN) IV  Physical Exam Vitals reviewed.  Constitutional:      General: She is not in acute distress.    Comments: Frail appearing  Pulmonary:     Effort: Pulmonary effort is normal.  Neurological:     Mental Status: She is alert. She is confused.     Comments: Nonsensical speech             Vital Signs: BP 107/70 (BP Location: Left Arm)    Pulse 74    Temp 98 F (36.7 C) (Axillary)    Resp 17    Ht _0  (1.676 m)    Wt 66.8 kg    SpO2 90%    BMI 23.77 kg/m  SpO2: SpO2: 90 % O2 Device: O2 Device: Nasal Cannula O2 Flow Rate: O2 Flow Rate (L/min): 2 L/min  Intake/output summary:   Intake/Output Summary (Last 24 hours) at 05/10/2020 1411 Last data filed at 05/10/2020 1330 Gross per 24 hour  Intake 1461.25 ml  Output 600 ml  Net 861.25 ml   LBM: Last BM Date:  (pt unable to recall ) Baseline Weight: Weight: 63.5 kg Most recent weight: Weight: 66.8 kg       Palliative Assessment/Data: PPS 20%        Palliative Care Assessment & Plan   HPI/Patient Profile:79 y.o.femalewith past medical history of dementia, hypertension, and hyperlipidemia presenting to the emergency departmenton 11/29/2021with AMS.Family noticed her to  be less responsive about 3 days prior, and they were unable to get her out of bed. In the ED, urinalysis is concerning for UTI and she was treated with ceftriaxone. Admitted to [facility] for management of acute metabolic encephalopathy.   Assessment: - acute encephalopathy in the setting of advanced dementia - failure to thrive  Recommendations/Plan: - DNR/DNI as previously documented - goal of care is for patient to be at home - family has decided for home with hospice ([facility])  Goals of Care and Additional Recommendations:  No aggressive interventions  Code Status: DNR  Prognosis:   < 6 months  Discharge Planning:  Home with Hospice  Care plan was discussed with Dr. Doristine Bosworth, [facility], hospice liaison  Thank you for allowing the Palliative Medicine Team to assist in the care of this patient.   Total Time 68 minutes Prolonged Time Billed  yes       Greater than 50%  of this time was spent counseling and coordinating care related to the above assessment and plan.  Lavena Bullion, NP  Please contact Palliative Medicine Team phone at [phone] for questions and concerns.

## 2020-05-10 NOTE — Plan of Care (Signed)
°  Problem: Coping: °Goal: Level of anxiety will decrease °Outcome: Progressing °  °

## 2020-05-10 NOTE — Progress Notes (Signed)
Manufacturing engineer [facility])  Received request from [facility] for hospice services at home after discharge.  Chart and pt information under review by [facility] physician.  Hospice eligibility pending at this time.  Hospital liaison spoke with Gloria Lloyd, spouse, to initiate education related to hospice philosophy and services and to answer any questions at this time.  Gloria Lloyd verbalized understanding of information given.  Per discussion the plan is to discharge home by PTAR.    Pease send signed and completed DNR home with pt/family.  Please provide prescriptions at discharge as needed to ensure ongoing symptom management.    DME needs discussed.  Family denies any DME needs at this time.   [facility] information and contact numbers given to spouse Gloria Lloyd.  Above information shared with Simonne Come Manager.  Please call with any questions or concerns.  Thank you for the opportunity to participate in this pt's care.  Domenic Moras, BSN, RN [facility] [phone] (24h on call)

## 2020-05-11 ENCOUNTER — Telehealth: Payer: Self-pay

## 2020-05-11 DIAGNOSIS — R531 Weakness: Secondary | ICD-10-CM

## 2020-05-11 DIAGNOSIS — Z515 Encounter for palliative care: Secondary | ICD-10-CM

## 2020-05-11 DIAGNOSIS — G9340 Encephalopathy, unspecified: Secondary | ICD-10-CM | POA: Diagnosis not present

## 2020-05-11 DIAGNOSIS — Z7189 Other specified counseling: Secondary | ICD-10-CM

## 2020-05-11 LAB — CBC WITH DIFFERENTIAL/PLATELET
Abs Immature Granulocytes: 0.08 10*3/uL — ABNORMAL HIGH (ref 0.00–0.07)
Basophils Absolute: 0.1 10*3/uL (ref 0.0–0.1)
Basophils Relative: 1 %
Eosinophils Absolute: 0.4 10*3/uL (ref 0.0–0.5)
Eosinophils Relative: 3 %
HCT: 38.4 % (ref 36.0–46.0)
Hemoglobin: 13.2 g/dL (ref 12.0–15.0)
Immature Granulocytes: 1 %
Lymphocytes Relative: 14 %
Lymphs Abs: 1.7 10*3/uL (ref 0.7–4.0)
MCH: 30.5 pg (ref 26.0–34.0)
MCHC: 34.4 g/dL (ref 30.0–36.0)
MCV: 88.7 fL (ref 80.0–100.0)
Monocytes Absolute: 1.2 10*3/uL — ABNORMAL HIGH (ref 0.1–1.0)
Monocytes Relative: 10 %
Neutro Abs: 8.8 10*3/uL — ABNORMAL HIGH (ref 1.7–7.7)
Neutrophils Relative %: 71 %
Platelets: 281 10*3/uL (ref 150–400)
RBC: 4.33 MIL/uL (ref 3.87–5.11)
RDW: 12.2 % (ref 11.5–15.5)
WBC: 12.3 10*3/uL — ABNORMAL HIGH (ref 4.0–10.5)
nRBC: 0 % (ref 0.0–0.2)

## 2020-05-11 LAB — BASIC METABOLIC PANEL
Anion gap: 11 (ref 5–15)
BUN: 6 mg/dL — ABNORMAL LOW (ref 8–23)
CO2: 24 mmol/L (ref 22–32)
Calcium: 8.5 mg/dL — ABNORMAL LOW (ref 8.9–10.3)
Chloride: 103 mmol/L (ref 98–111)
Creatinine, Ser: 0.53 mg/dL (ref 0.44–1.00)
GFR, Estimated: >60 mL/min (ref >60)
Glucose, Bld: 101 mg/dL — ABNORMAL HIGH (ref 70–99)
Potassium: 3.8 mmol/L (ref 3.5–5.1)
Sodium: 138 mmol/L (ref 135–145)

## 2020-05-11 MED ORDER — LORAZEPAM 0.5 MG PO TABS
0.5000 mg | ORAL_TABLET | ORAL | Status: AC
  Administered 2020-05-11: 0.5 mg via ORAL
  Filled 2020-05-11: qty 1

## 2020-05-11 NOTE — Plan of Care (Signed)
  Problem: Nutrition: Goal: Adequate nutrition will be maintained Outcome: Progressing   Problem: Coping: Goal: Level of anxiety will decrease Outcome: Progressing   

## 2020-05-11 NOTE — Telephone Encounter (Signed)
Vm left at triage.  Horris Latino, with [facility], states they have a hospice referral for the pt.  She is following up to see if Dr. Diona Browner will be pt's attending and agrees with hospice dx.  Plz call Horris Latino at [phone].

## 2020-05-11 NOTE — TOC Initial Note (Signed)
Transition of Care [facility]) - Initial/Assessment Note    Patient Details  Name: Gloria Lloyd MRN: [id number] Date of Birth: 04/18/1941  Transition of [facility]) CM/SW Contact:    Bartholomew Crews, RN Phone Number: [phone] 05/11/2020, 12:39 PM  Clinical Narrative:                  Spoke with daughter, Gloria Lloyd, at the bedside to discuss plans to transition home with hospice. Discussed the difference between home health and home with hospice and that neither options provides 24/7 care. Discussed home hospice and benefits of having a weekly nurse visit, a bath aide 2/3 times a week, and hospice support 24/7. Gloria Lloyd stated that her dad has chosen home with hospice with [facility] which she agrees. Discussed oxygen being delivered to the home, however, is currently not on oxygen. Discussed home care services to complement hospice care.   A little later spoke with spouse, Gloria Lloyd, at the bedside. He is ready to bring patient home. Discussed PTAR to provide transport. Discussed home care resources. Discussed referral to [facility] to discuss possible home care options. Spouse agreeable to referral. Spoke with Tim at [facility] who will follow up with spouse.   PTAR has been arranged. Medical transport paperwork on chart.   TOC following for transition needs.   Expected Discharge Plan: Home w Hospice Care Barriers to Discharge: No Barriers Identified   Patient Goals and CMS Choice Patient states their goals for this hospitalization and ongoing recovery are:: home with hospice and family support CMS Medicare.gov Compare Post Acute Care list provided to:: Patient Represenative (must comment) Choice offered to / list presented to : Spouse  Expected Discharge Plan and Services Expected Discharge Plan: Home w Hospice Care In-house Referral: Hospice / Palliative Care Discharge Planning Services: CM Consult Post Acute Care Choice: Hospice Living arrangements for the past 2 months: Single  Family Home Expected Discharge Date: 05/11/20                                    Prior Living Arrangements/Services Living arrangements for the past 2 months: Single Family Home Lives with:: Self, Spouse, Adult Children                   Activities of Daily Living   ADL Screening (condition at time of admission) Patient's cognitive ability adequate to safely complete daily activities?: No Is the patient deaf or have difficulty hearing?: No Does the patient have difficulty seeing, even when wearing glasses/contacts?: No Does the patient have difficulty concentrating, remembering, or making decisions?: Yes Patient able to express need for assistance with ADLs?: No Does the patient have difficulty dressing or bathing?: Yes Independently performs ADLs?: No Does the patient have difficulty walking or climbing stairs?: Yes Weakness of Legs: Both Weakness of Arms/Hands: Both  Permission Sought/Granted                  Emotional Assessment              Admission diagnosis:  Hypokalemia [E87.6] Confusion [R41.0] Serum total bilirubin elevated [R17] Acute encephalopathy [G93.40] Generalized weakness [R53.1] Urinary tract infection without hematuria, site unspecified [N39.0] Patient Active Problem List   Diagnosis Date Noted  . Generalized weakness   . Palliative care by specialist   . Encounter for hospice care discussion   . Acute encephalopathy 05/08/2020  . Altered mental status 01/13/2020  .  Agitation 12/25/2018  . Advanced care planning/counseling discussion 05/23/2015  . Severe dementia ([place]) 08/30/2014  . Stopped smoking with greater than 40 pack year history 02/19/2013  . Screening examination for venereal disease 01/04/2013  . Osteopenia 03/08/2011  . VAGINITIS, ATROPHIC 04/13/2010  . GERD 03/29/2010  . PERSONAL HX COLONIC POLYPS 03/29/2010  . UNSPECIFIED ARTHROPATHY MULTIPLE SITES 05/31/2009  . Gout 12/20/2008  . POLYCYTHEMIA 08/25/2008  .  TOBACCO USE, QUIT 08/15/2008  . HYPERCALCEMIA 07/01/2007  . HERPES LABIALIS 04/06/2007  . Hypothyroidism 04/06/2007  . Hyperlipidemia 04/06/2007  . DISORDER, CALCIUM METABOLISM NOS 04/06/2007  . Essential hypertension, benign 04/06/2007   PCP:  Jinny Sanders, MD Pharmacy:   [facility] [place], [place] - [place] [address] AT [place] [address] Phone: [phone] Fax: [phone]     Social Determinants of Health (SDOH) Interventions    Readmission Risk Interventions No flowsheet data found.

## 2020-05-11 NOTE — Discharge Instructions (Signed)
Deconditioning °Deconditioning refers to the changes in the body that occur during a period of inactivity. The changes happen in the heart, lungs, and muscles. They make you feel tired and weak (fatigued) and decrease your ability to be active. The three stages of deconditioning include: °· Mild deconditioning. This is a change in your ability to do your usual exercise activities, such as running, biking, or swimming. °· Moderate deconditioning. This is a change in your ability to do normal everyday activities, such as walking, shopping for groceries, and doing chores. °· Severe deconditioning. In this stage, you may not be able to do minimal activity or usual self-care. °What are the causes? °Deconditioning can occur after only a few days of inactivity. The longer the period of inactivity, the more severe the deconditioning will be, and the longer it will take to return to your previous level of functioning. Deconditioning is often caused by inactivity due to: °· Illnesses, such as cancer, stroke, heart attack, fibromyalgia, and chronic fatigue syndrome. °· Injuries, especially back injuries, broken bones, and injuries to soft tissues, such as ligaments and tendons. °· A long stay in the hospital. °· Pregnancy, especially if long periods of bed rest are needed. °What increases the risk? °The following factors may make you more likely to develop this condition: °· Staying in the hospital or being on bed rest. °· Obesity. °· Poor nutrition. °· Being an older adult. °· Having an injury or illness that affects your movement and activity. °What are the signs or symptoms? °Symptoms of this condition include: °· Weakness and tiredness. °· Shortness of breath with minor physical effort (exertion). °· A heartbeat that is faster than normal. You may not notice this without taking your pulse. °· Pain or discomfort with activity. °· Decreased strength, endurance, and balance. °· Difficulty doing your usual forms of  exercise. °· Difficulty doing activities of daily living, such as grocery shopping or chores. You may also have problems walking around the house and doing basic self-care, such as getting to the bathroom, preparing meals, or doing laundry. °How is this diagnosed? °This condition is diagnosed based on your medical history and a physical exam. During the physical exam, your health care provider will check for signs of deconditioning, such as: °· Decreased size of muscles. °· Decreased strength. °· Trouble with balance. °· Shortness of breath or a heart rate that is faster than normal after minor exertion. °How is this treated? °Treatment for this condition involves an exercise program in which activity is increased slowly. Your health care provider will tell you which exercises are right for you. The exercise program will likely include: °· Aerobic exercise. This type of exercise helps improve the functioning of the heart, lungs, and muscles. °· Strength training. This type of exercise helps increase muscle size and strength. °Both of these types of exercise will improve your endurance. You may be referred to a physical therapist who can create a safe strengthening program for you to follow. °Follow these instructions at home: °Eating and drinking ° °· Eat a healthy, well-balanced diet. This includes: °? Proteins, such as lean meats and fish, to build muscles. °? Fresh fruits and vegetables. °? Carbohydrates, such as whole grains, to boost energy. °· Drink enough fluid to keep your urine pale yellow. °Activity ° °· Follow the exercise program that is recommended by your health care provider or physical therapist. °· Do not increase your exercise any faster than directed. °General instructions °· Take over-the-counter and prescription medicines only   as told by your health care provider. °· Do not use any products that contain nicotine or tobacco, such as cigarettes, e-cigarettes, and chewing tobacco. If you need help  quitting, ask your health care provider. °· Keep all follow-up visits as told by your health care provider. This is important. °Contact a health care provider if: °· You are not able to do the recommended exercise program. °· You are becoming more and more tired and weak. °· You become light-headed when rising to a sitting or standing position. °· Your level of endurance decreases after it has improved. °Get help right away if you: °· Have chest pain. °· Are very short of breath. °· Have any episodes of fainting. °Summary °· Deconditioning refers to the changes in the body that occur during a period of inactivity. °· Deconditioning happens in the heart, lungs, and muscles. The changes make you feel tired and weak and decrease your ability to be active. °· Treatment for deconditioning involves an exercise program in which activity is increased slowly. °This information is not intended to replace advice given to you by your health care provider. Make sure you discuss any questions you have with your health care provider. °Document Revised: 10/22/2018 Document Reviewed: 10/22/2018 °Elsevier Patient Education © 2020 Elsevier Inc. ° °

## 2020-05-11 NOTE — Progress Notes (Signed)
[facility] ([facility])  Pt to discharge later today back home with hospice support.  [facility] will need to order O2 for comfort, spouse has denied any other DME needs.  Pt will need ambulance transport home.  If comfort medications have been needed recently, please arrange for those to be sent in.  Hospice will likely not start services with family until Friday. This will ensure that there is no lapse in her comfort until them.    Please send completed DNR as well.   Venia Carbon RN, BSN, [facility] Liaison

## 2020-05-11 NOTE — Telephone Encounter (Signed)
Yes I will be MD with hospice back up.

## 2020-05-11 NOTE — Discharge Summary (Signed)
Physician Discharge Summary  KRISS PERLEBERG ZOX:[id number] DOB: [birth year] DOA: 05/08/2020  PCP: Jinny Sanders, MD  Admit date: 05/08/2020 Discharge date: 05/11/2020  Admitted From: Home Disposition: Home with hospice  Recommendations for Outpatient Follow-up:  1. Follow up with PCP in 1-2 weeks 2. Please obtain BMP/CBC in one week 3. Please follow up with your PCP on the following pending results: Unresulted Labs (From admission, onward)         None       Home Health: None Equipment/Devices: None  Discharge Condition: Stable currently with poor prognosis CODE STATUS: DNR Diet recommendation: Cardiac  Subjective: Seen and examined.  Patient is still weak and lethargic and encephalopathy.  Brief/Interim Summary: Ms. parada is an 79 year old female with history of advanced dementia, hypertension, dyslipidemia, hypothyroidism admitted 11/29 with worsening lethargy.  Patient is mostly confused at baseline and requires assistance in most ADLs, is able to walk few steps to the bathroom with support.  -Family reported worsening lethargy for 3 to 4 days following Thanksgiving. -Work-up in the ED noted leukocytosis and abnormal urinalysis.  She was admitted with acute metabolic encephalopathy secondary to UTI and possibly advancing dementia.  She was started on Rocephin.  Urine culture was sent but remain negative to date.  She remained afebrile with no leukocytosis.  Despite of treatment for UTI, patient's mental status did not improve.  Palliative care was consulted.  This seems to be likely a progression of her dementia.  She was seen by PT OT as well.  We offered the family a discharge to skilled nursing facility however patient's husband clearly declined that offer and wants her to come home.  He also expressed that even together him and his daughter are unable to care for the patient and she requires more help but despite of that, he wants to take her home.  He subsequently signed up  for home with hospice.  Hospice will help him at home.  He verbalized that he does not need any equipment either.  I personally confirmed all of this with the patient's husband.  Patient is being discharged home with hospice.  No further antibiotics needed as she received 3 days of IV Rocephin.  Discharge Diagnoses:  Principal Problem:   Acute encephalopathy Active Problems:   Hypothyroidism   Hyperlipidemia   Essential hypertension, benign   Severe dementia ([place])   Generalized weakness   Palliative care by specialist   Encounter for hospice care discussion    Discharge Instructions   Allergies as of 05/11/2020      Reactions   Penicillins    REACTION: As a child   Sulfonamide Derivatives    REACTION: Rash, itch.      Medication List    TAKE these medications   amLODipine 5 MG tablet Commonly known as: NORVASC Take 1 tablet (5 mg total) by mouth daily.   citalopram 20 MG tablet Commonly known as: CELEXA TAKE 1 TABLET(20 MG) BY MOUTH AT BEDTIME What changed: See the new instructions.   donepezil 10 MG tablet Commonly known as: ARICEPT TAKE 1 TABLET BY MOUTH AT BEDTIME   levothyroxine 100 MCG tablet Commonly known as: SYNTHROID TAKE 1 TABLET(100 MCG) BY MOUTH DAILY What changed:   how much to take  how to take this  when to take this  additional instructions   LORazepam 0.5 MG tablet Commonly known as: ATIVAN TAKE 1 TABLET(0.5 MG) BY MOUTH EVERY 8 HOURS AS NEEDED FOR ANXIETY What changed: See the new instructions.  VITAMIN D3 PO Take 1 capsule by mouth daily.       Follow-up Information    Bedsole, Amy E, MD Follow up in 1 week(s).   Specialty: Family Medicine Contact information: [place] [address] [phone]              Allergies  Allergen Reactions  . Penicillins     REACTION: As a child  . Sulfonamide Derivatives     REACTION: Rash, itch.    Consultations: Palliative  care   Procedures/Studies: CT HEAD WO CONTRAST  Result Date: 05/08/2020 CLINICAL DATA:  Delirium, altered mental status EXAM: CT HEAD WITHOUT CONTRAST TECHNIQUE: Contiguous axial images were obtained from the base of the skull through the vertex without intravenous contrast. COMPARISON:  MRI 02/14/2015 FINDINGS: Brain: No evidence of acute infarction, hemorrhage, hydrocephalus, extra-axial collection, visible mass lesion or mass effect. Diffuse prominence of the ventricles, cisterns and sulci compatible with parenchymal volume loss slightly more pronounced in the frontal lobes and right temporal lobe. Patchy areas of white matter hypoattenuation are most compatible with chronic microvascular angiopathy. Vascular: Atherosclerotic calcification of the carotid siphons and intradural vertebral arteries. No hyperdense vessel. Skull: Motion degradation is seen most pronounced towards the vertex. Prominent venous [place] versus remote burr hole of the right frontal bone. No calvarial fracture or suspicious osseous lesion. No scalp swelling or hematoma. Sinuses/Orbits: Paranasal sinuses and mastoid air cells are predominantly clear. Included orbital structures are unremarkable. Other: None. IMPRESSION: 1. Motion degradation is seen most pronounced towards the vertex. 2. No definite acute intracranial abnormality. 3. Chronic microvascular angiopathy and parenchymal volume loss. Electronically Signed   By: Lovena Le M.D.   On: 05/08/2020 17:09   DG Chest Port 1 View  Result Date: 05/08/2020 CLINICAL DATA:  Increasing weakness EXAM: PORTABLE CHEST 1 VIEW COMPARISON:  02/19/2013 FINDINGS: Cardiac shadow is at the upper limits of normal in size. Lungs are well aerated bilaterally. Mild left basilar atelectatic changes are seen with small effusion. No bony abnormality is noted. IMPRESSION: Mild left basilar atelectatic changes with associated effusion. Electronically Signed   By: Inez Catalina M.D.   On: 05/08/2020  02:55      Discharge Exam: Vitals:   05/11/20 0630 05/11/20 0930  BP: 136/81 (!) 142/82  Pulse: 66 68  Resp: 16 16  Temp: 98.2 F (36.8 C) 98.2 F (36.8 C)  SpO2: 92% 91%   Vitals:   05/10/20 1652 05/10/20 2112 05/11/20 0630 05/11/20 0930  BP: 133/82 (!) 147/88 136/81 (!) 142/82  Pulse: 70 77 66 68  Resp: 18 16 16 16   Temp: 98.1 F (36.7 C) 98.4 F (36.9 C) 98.2 F (36.8 C) 98.2 F (36.8 C)  TempSrc: Axillary  Oral Oral  SpO2: 93% 93% 92% 91%  Weight:  66.8 kg 67.3 kg   Height:        General: Pt is lethargic but comfortable Cardiovascular: RRR, S1/S2 +, no rubs, no gallops Respiratory: CTA bilaterally, no wheezing, no rhonchi Abdominal: Soft, NT, ND, bowel sounds + Extremities: no edema, no cyanosis    The results of significant diagnostics from this hospitalization (including imaging, microbiology, ancillary and laboratory) are listed below for reference.     Microbiology: Recent Results (from the past 240 hour(s))  Blood culture (routine single)     Status: None (Preliminary result)   Collection Time: 05/08/20  4:13 AM   Specimen: BLOOD  Result Value Ref Range Status   Specimen Description BLOOD SITE NOT  SPECIFIED  Final   Special Requests   Final    BOTTLES DRAWN AEROBIC AND ANAEROBIC Blood Culture adequate volume   Culture   Final    NO GROWTH 3 DAYS Performed at [facility], [address], [place], [place] [address]    Report Status PENDING  Incomplete  Resp Panel by RT-PCR (Flu A&B, Covid) Nasopharyngeal Swab     Status: None   Collection Time: 05/08/20  6:35 AM   Specimen: Nasopharyngeal Swab; Nasopharyngeal(NP) swabs in vial transport medium  Result Value Ref Range Status   SARS Coronavirus 2 by RT PCR NEGATIVE NEGATIVE Final    Comment: (NOTE) SARS-CoV-2 target nucleic acids are NOT DETECTED.  The SARS-CoV-2 RNA is generally detectable in upper respiratory specimens during the acute phase of infection. The lowest concentration of  SARS-CoV-2 viral copies this assay can detect is 138 copies/mL. A negative result does not preclude SARS-Cov-2 infection and should not be used as the sole basis for treatment or other patient management decisions. A negative result may occur with  improper specimen collection/handling, submission of specimen other than nasopharyngeal swab, presence of viral mutation(s) within the areas targeted by this assay, and inadequate number of viral copies(<138 copies/mL). A negative result must be combined with clinical observations, patient history, and epidemiological information. The expected result is Negative.  Fact Sheet for Patients:  EntrepreneurPulse.com.au  Fact Sheet for Healthcare Providers:  IncredibleEmployment.be  This test is no t yet approved or cleared by the [place] FDA and  has been authorized for detection and/or diagnosis of SARS-CoV-2 by FDA under an Emergency Use Authorization (EUA). This EUA will remain  in effect (meaning this test can be used) for the duration of the COVID-19 declaration under Section 564(b)(1) of the Act, 21 U.S.C.section 360bbb-3(b)(1), unless the authorization is terminated  or revoked sooner.       Influenza A by PCR NEGATIVE NEGATIVE Final   Influenza B by PCR NEGATIVE NEGATIVE Final    Comment: (NOTE) The Xpert Xpress SARS-CoV-2/FLU/RSV plus assay is intended as an aid in the diagnosis of influenza from Nasopharyngeal swab specimens and should not be used as a sole basis for treatment. Nasal washings and aspirates are unacceptable for Xpert Xpress SARS-CoV-2/FLU/RSV testing.  Fact Sheet for Patients: EntrepreneurPulse.com.au  Fact Sheet for Healthcare Providers: IncredibleEmployment.be  This test is not yet approved or cleared by the [place] FDA and has been authorized for detection and/or diagnosis of SARS-CoV-2 by FDA under an Emergency Use  Authorization (EUA). This EUA will remain in effect (meaning this test can be used) for the duration of the COVID-19 declaration under Section 564(b)(1) of the Act, 21 U.S.C. section 360bbb-3(b)(1), unless the authorization is terminated or revoked.  Performed at [facility], [place] [address] [place], [place] [address]   Urine culture     Status: None   Collection Time: 05/09/20  2:51 PM   Specimen: In/Out Cath Urine  Result Value Ref Range Status   Specimen Description IN/OUT CATH URINE  Final   Special Requests NONE  Final   Culture   Final    NO GROWTH Performed at [facility], [place] [address], [place], [place] [address]    Report Status 05/10/2020 FINAL  Final     Labs: BNP (last 3 results) No results for input(s): BNP in the last 8760 hours. Basic Metabolic Panel: Recent Labs  Lab 05/08/20 0230 05/09/20 0643 05/10/20 0210 05/11/20 0505  NA 136 134* 138  138  K 3.3* 2.9* 4.5 3.8  CL 100 98 103 103  CO2 25 27 24 24   GLUCOSE 125* 123* 97 101*  BUN 16 12 8  6*  CREATININE 0.78 0.63 0.69 0.53  CALCIUM 8.8* 8.5* 8.8* 8.5*   Liver Function Tests: Recent Labs  Lab 05/08/20 0230  AST 19  ALT 9  ALKPHOS 47  BILITOT 2.2*  PROT 6.5  ALBUMIN 2.8*   No results for input(s): LIPASE, AMYLASE in the last 168 hours. No results for input(s): AMMONIA in the last 168 hours. CBC: Recent Labs  Lab 05/08/20 0230 05/09/20 0643 05/10/20 0210 05/11/20 0505  WBC 19.2* 15.3* 13.8* 12.3*  NEUTROABS 15.6*  --   --  8.8*  HGB 15.8* 13.7 13.3 13.2  HCT 48.4* 41.9 40.9 38.4  MCV 90.3 89.9 90.3 88.7  PLT 251 261 272 281   Cardiac Enzymes: No results for input(s): CKTOTAL, CKMB, CKMBINDEX, TROPONINI in the last 168 hours. BNP: Invalid input(s): POCBNP CBG: No results for input(s): GLUCAP in the last 168 hours. D-Dimer No results for input(s): DDIMER in the last 72 hours. Hgb A1c No results for input(s): HGBA1C in the last 72 hours. Lipid Profile No results  for input(s): CHOL, HDL, LDLCALC, TRIG, CHOLHDL, LDLDIRECT in the last 72 hours. Thyroid function studies Recent Labs    05/08/20 1742  TSH 0.506   Anemia work up No results for input(s): VITAMINB12, FOLATE, FERRITIN, TIBC, IRON, RETICCTPCT in the last 72 hours. Urinalysis    Component Value Date/Time   COLORURINE STRAW (A) 05/08/2020 0425   APPEARANCEUR TURBID (A) 05/08/2020 0425   LABSPEC 1.009 05/08/2020 0425   PHURINE 5.0 05/08/2020 0425   GLUCOSEU NEGATIVE 05/08/2020 0425   HGBUR SMALL (A) 05/08/2020 0425   HGBUR negative 04/13/2010 0829   BILIRUBINUR NEGATIVE 05/08/2020 0425   BILIRUBINUR Negative 12/25/2018 1546   KETONESUR NEGATIVE 05/08/2020 0425   PROTEINUR 100 (A) 05/08/2020 0425   UROBILINOGEN 0.2 12/25/2018 1546   UROBILINOGEN 0.2 04/13/2010 0829   NITRITE NEGATIVE 05/08/2020 0425   LEUKOCYTESUR MODERATE (A) 05/08/2020 0425   Sepsis Labs Invalid input(s): PROCALCITONIN,  WBC,  LACTICIDVEN Microbiology Recent Results (from the past 240 hour(s))  Blood culture (routine single)     Status: None (Preliminary result)   Collection Time: 05/08/20  4:13 AM   Specimen: BLOOD  Result Value Ref Range Status   Specimen Description BLOOD SITE NOT SPECIFIED  Final   Special Requests   Final    BOTTLES DRAWN AEROBIC AND ANAEROBIC Blood Culture adequate volume   Culture   Final    NO GROWTH 3 DAYS Performed at [facility], [address], [place], [place] [address]    Report Status PENDING  Incomplete  Resp Panel by RT-PCR (Flu A&B, Covid) Nasopharyngeal Swab     Status: None   Collection Time: 05/08/20  6:35 AM   Specimen: Nasopharyngeal Swab; Nasopharyngeal(NP) swabs in vial transport medium  Result Value Ref Range Status   SARS Coronavirus 2 by RT PCR NEGATIVE NEGATIVE Final    Comment: (NOTE) SARS-CoV-2 target nucleic acids are NOT DETECTED.  The SARS-CoV-2 RNA is generally detectable in upper respiratory specimens during the acute phase of infection. The  lowest concentration of SARS-CoV-2 viral copies this assay can detect is 138 copies/mL. A negative result does not preclude SARS-Cov-2 infection and should not be used as the sole basis for treatment or other patient management decisions. A negative result may occur with  improper specimen collection/handling, submission  of specimen other than nasopharyngeal swab, presence of viral mutation(s) within the areas targeted by this assay, and inadequate number of viral copies(<138 copies/mL). A negative result must be combined with clinical observations, patient history, and epidemiological information. The expected result is Negative.  Fact Sheet for Patients:  EntrepreneurPulse.com.au  Fact Sheet for Healthcare Providers:  IncredibleEmployment.be  This test is no t yet approved or cleared by the [place] FDA and  has been authorized for detection and/or diagnosis of SARS-CoV-2 by FDA under an Emergency Use Authorization (EUA). This EUA will remain  in effect (meaning this test can be used) for the duration of the COVID-19 declaration under Section 564(b)(1) of the Act, 21 U.S.C.section 360bbb-3(b)(1), unless the authorization is terminated  or revoked sooner.       Influenza A by PCR NEGATIVE NEGATIVE Final   Influenza B by PCR NEGATIVE NEGATIVE Final    Comment: (NOTE) The Xpert Xpress SARS-CoV-2/FLU/RSV plus assay is intended as an aid in the diagnosis of influenza from Nasopharyngeal swab specimens and should not be used as a sole basis for treatment. Nasal washings and aspirates are unacceptable for Xpert Xpress SARS-CoV-2/FLU/RSV testing.  Fact Sheet for Patients: EntrepreneurPulse.com.au  Fact Sheet for Healthcare Providers: IncredibleEmployment.be  This test is not yet approved or cleared by the [place] FDA and has been authorized for detection and/or diagnosis of SARS-CoV-2 by FDA under  an Emergency Use Authorization (EUA). This EUA will remain in effect (meaning this test can be used) for the duration of the COVID-19 declaration under Section 564(b)(1) of the Act, 21 U.S.C. section 360bbb-3(b)(1), unless the authorization is terminated or revoked.  Performed at [facility], [place] [address] [place], [place] [address]   Urine culture     Status: None   Collection Time: 05/09/20  2:51 PM   Specimen: In/Out Cath Urine  Result Value Ref Range Status   Specimen Description IN/OUT CATH URINE  Final   Special Requests NONE  Final   Culture   Final    NO GROWTH Performed at [facility], [place] [address], [place],  [address]    Report Status 05/10/2020 FINAL  Final     Time coordinating discharge: Over 30 minutes  SIGNED:   Darliss Cheney, MD  [facility] 05/11/2020, 11:49 AM  If 7PM-7AM, please contact night-coverage [email or web address]

## 2020-05-11 NOTE — Progress Notes (Signed)
DISCHARGE NOTE HOME COBI ALDAPE to be discharged Home per MD order. Discussed prescriptions and follow up appointments with the patient. Prescriptions given to patient; medication list explained in detail. Patient verbalized understanding.  Skin clean, dry and intact without evidence of skin break down, no evidence of skin tears noted. IV catheter discontinued intact. Site without signs and symptoms of complications. Dressing and pressure applied. Pt denies pain at the site currently. No complaints noted.  Patient free of lines, drains, and wounds.   An After Visit Summary (AVS) was printed and given to the patient. Patient escorted via wheelchair, and discharged home via private auto.  Dolores Hoose, RN

## 2020-05-12 NOTE — Telephone Encounter (Addendum)
Gloria Lloyd with Hospice notified by telephone that Dr. Diona Browner will be Mrs [facility] attending provider and agrees with Hospice Dx.

## 2020-05-13 LAB — CULTURE, BLOOD (SINGLE)
Culture: NO GROWTH
Special Requests: ADEQUATE

## 2020-05-15 ENCOUNTER — Telehealth: Payer: Self-pay | Admitting: *Deleted

## 2020-05-15 NOTE — Telephone Encounter (Signed)
Izora Gala with [facility] left a voicemail stating that they admitted the patient Friday night for their care. Izora Gala stated that they talked with her husband about discontinuing the Donepezil and if Dr. Diona Browner is okay with stopping it they need to know how to taper patient off of it.  Izora Gala is requesting a call back.

## 2020-05-16 MED ORDER — DONEPEZIL HCL 5 MG PO TABS: 5.0000 mg | ORAL_TABLET | Freq: Every day | ORAL | 0 refills | Status: AC

## 2020-05-16 NOTE — Telephone Encounter (Signed)
Left detailed message for Gloria Lloyd with information below from Dr. Diona Browner.  I also spoke with [name] and notified him as well.  Rx for Aricept 5 mg #14 with no refills sent to [facility].

## 2020-05-16 NOTE — Telephone Encounter (Signed)
Call family or hospice... have her decrease to 5 mg daily  x 2 weeks then stop ( send in new rx if they do not have old 5 mg/they cannot cut the 10 mg  Can call in donezepil 5 mg 1 tabs po daily #14 0RF

## 2020-05-21 ENCOUNTER — Other Ambulatory Visit: Payer: Self-pay | Admitting: Family Medicine

## 2020-05-22 NOTE — Telephone Encounter (Signed)
Pended for your approval or denial.

## 2020-07-21 ENCOUNTER — Other Ambulatory Visit: Payer: Self-pay | Admitting: Family Medicine

## 2020-07-21 NOTE — Telephone Encounter (Signed)
Last office visit 11/09/2019 for agressive behavior.  Last refilled 04/13/2020 for #90 with no refills.  No future appointments.

## 2020-07-25 ENCOUNTER — Telehealth: Payer: Self-pay | Admitting: *Deleted

## 2020-07-25 NOTE — Telephone Encounter (Signed)
Okay to give verbal and D/C amlodipine from her med list due to low BPs.

## 2020-07-25 NOTE — Telephone Encounter (Signed)
Gloria Lloyd with [facility] left a voicemail stating that patient is due for re certification for their services. Gloria Lloyd stated that she would like a verbal order to continue their services. Gloria Lloyd stated that she also wanted to let Dr. Diona Browner know that they are holding patient's amlodipine because her blood pressure has been running 99/65 and around that with out this medication.Gloria Lloyd

## 2020-07-26 NOTE — Telephone Encounter (Signed)
Gloria Lloyd given verbal orders for re certification of services and to discontinue amlodipine due to low blood pressure readings per Dr. Diona Browner.

## 2020-08-08 ENCOUNTER — Other Ambulatory Visit: Payer: Self-pay | Admitting: Family Medicine

## 2020-08-08 ENCOUNTER — Telehealth: Payer: Self-pay

## 2020-08-08 MED ORDER — OMEPRAZOLE 40 MG PO CPDR: 40.0000 mg | DELAYED_RELEASE_CAPSULE | Freq: Every day | ORAL | 0 refills | Status: DC

## 2020-08-08 NOTE — Telephone Encounter (Signed)
Left message for Gloria Lloyd to return my call.

## 2020-08-08 NOTE — Telephone Encounter (Signed)
Izora Gala with [facility] left v/m that earlier this morning pt had moderate  Dark colored rectal bleed while passing blood clots; pt did not seem in any pain. Pt appears very comfortable. pts daughter will call Izora Gala if sees more blood. Izora Gala wants to know if anything needed to do and request note sent to Dr Diona Browner. I tried calling Izora Gala for more info;  Izora Gala cleaned pt and the rectal area was still clean when [place] left the house.vitals were normal BP 121/80, no SOB and pt laying in bed and grinning. Pt not really eating; Izora Gala not sure if internal hemorrhoid or GI bleed and daughter was very concerned what the cause for the bleeding might be. Izora Gala request cb after Dr Diona Browner reviews the note.

## 2020-08-08 NOTE — Telephone Encounter (Signed)
Noted  

## 2020-08-08 NOTE — Telephone Encounter (Signed)
Gloria Lloyd with [facility] and Leana Roe (patient's daughter) notified as instructed by telephone.  They deny any NSAIDs or Pepto Bismol use.  They both confirmed no bright red blood.  Blood pressure has been running normal.   Patient is not able to come to office for appointment.  She is bed bound with end stage dementia.  Omeprazole sent to patient's pharmacy as instructed by Dr. Diona Browner.  ER precautions given.   FYI to Dr. Diona Browner.

## 2020-08-08 NOTE — Telephone Encounter (Signed)
Pt well known to me with severe dementia. Stop any blood thinners, NSAIDs ( none on med list). Make sure not using pepto bismol as can cause stool to be black. No bright red blood right? That would be hemorrhoid or lower GI bleed.  Start omeprazole 40 mg daily for possible upper GI bleed.  Follow blood pressure.  Pt needs to be seen.. make appt with me 08/10/20... if  any  further large dark stools or bright red blood .. go to  urgent care or ER if large amount or low BP<90/60.Marland Kitchen

## 2020-08-08 NOTE — Telephone Encounter (Signed)
Gloria Lloyd  With [facility] left v/m returning [employer] call.

## 2020-09-06 ENCOUNTER — Other Ambulatory Visit: Payer: Self-pay | Admitting: Family Medicine

## 2020-09-08 DEATH — deceased

## 2022-05-22 IMAGING — CT CT HEAD W/O CM
3 of 4 series · 15 of 47 positions shown, 18 images · non-contrast
Comparison: MRI 02/14/2015

CLINICAL DATA: Delirium, altered mental status

EXAM:
CT HEAD WITHOUT CONTRAST
TECHNIQUE: Contiguous axial images were obtained from the base of the skull
through the vertex without intravenous contrast.

[Series 4: head 3.0 mpr cor · coronal · 0.33mm/px · 3 of 67 slices shown]
[im 23/67  brain]
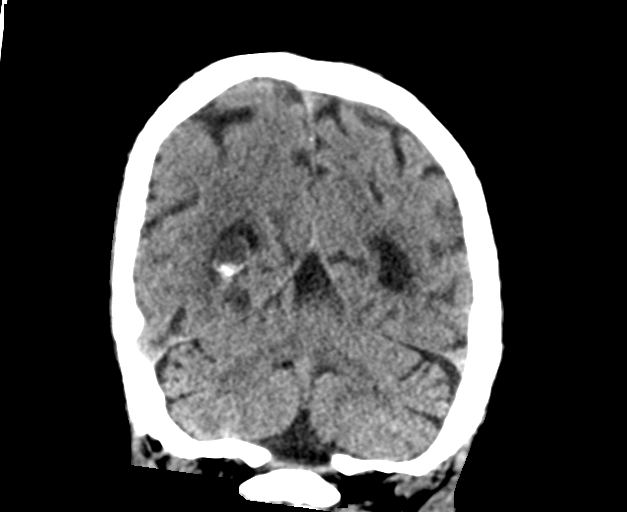
[im 30/67  brain]
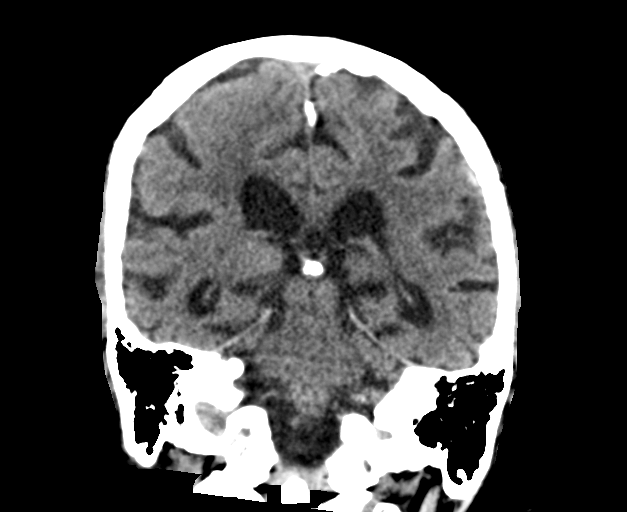
[im 37/67  brain]
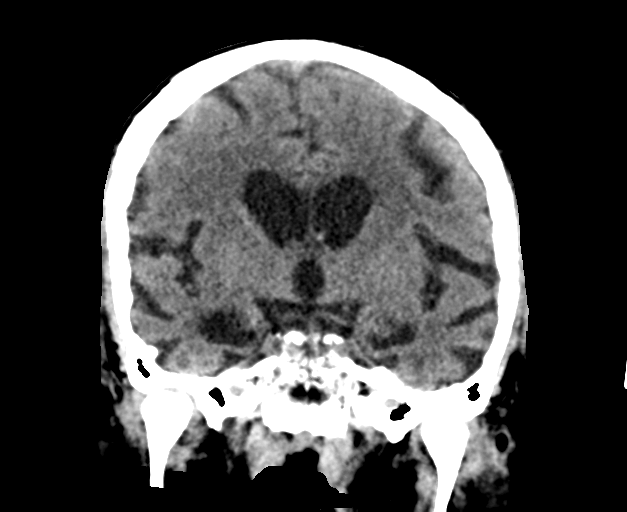

[Series 5: head 2.0 h70h · axial · 0.41mm/px · z∈[+1375,+1509]mm · 9 of 85 slices shown, 12 images]
[im 9/85  brain]
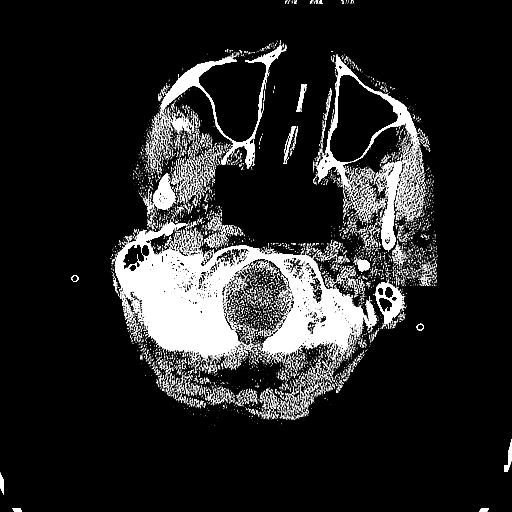
[im 9/85  bone]
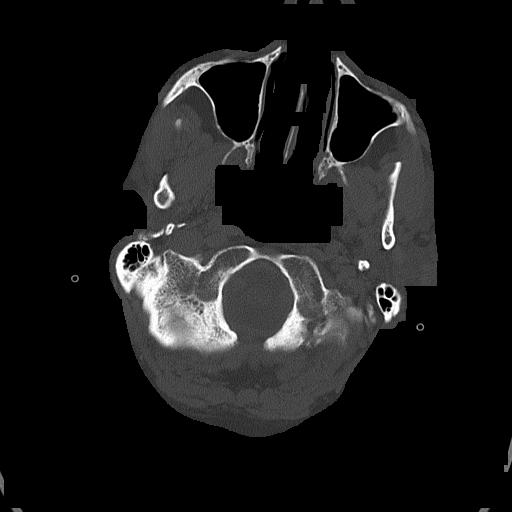
[im 17/85  brain]
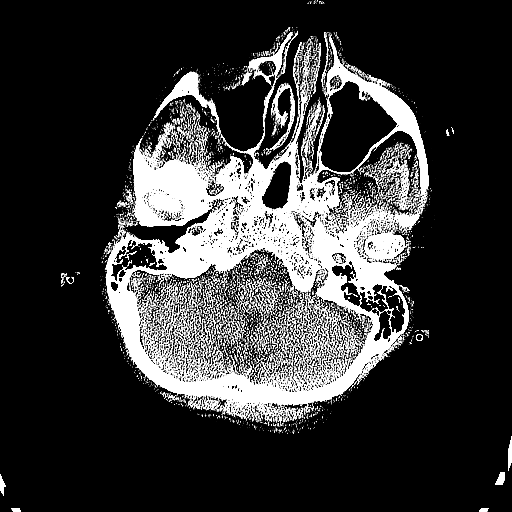
[im 26/85  brain]
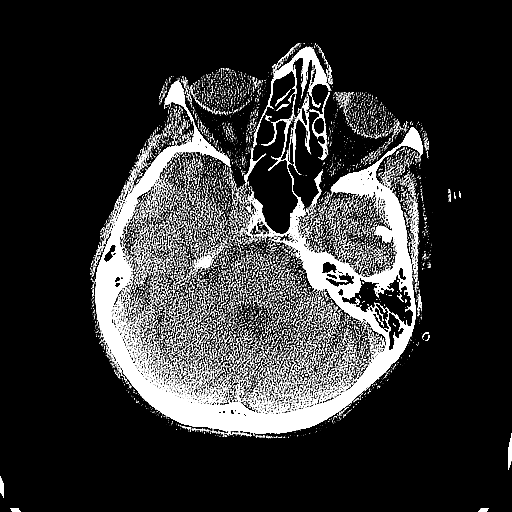
[im 34/85  brain]
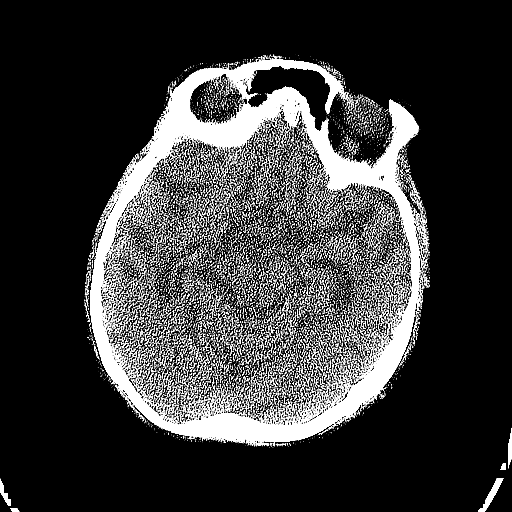
[im 43/85  brain]
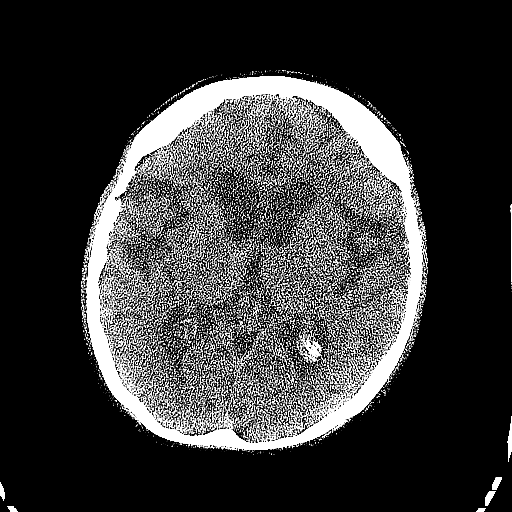
[im 43/85  bone]
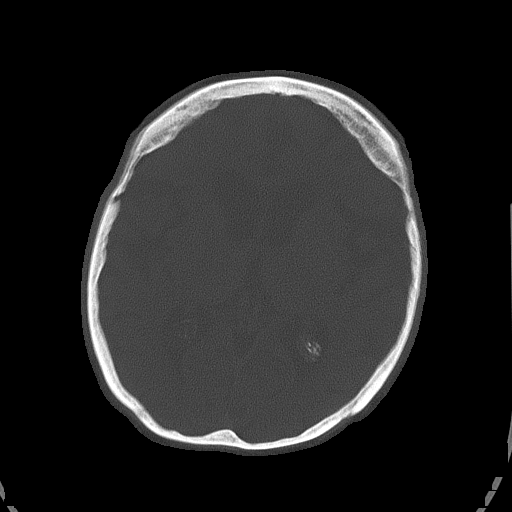
[im 51/85  brain]
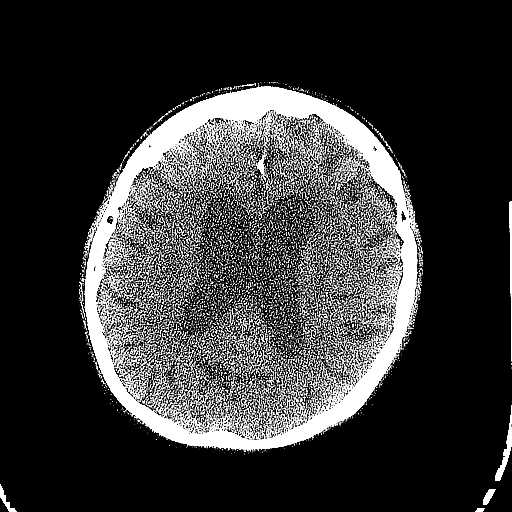
[im 59/85  brain]
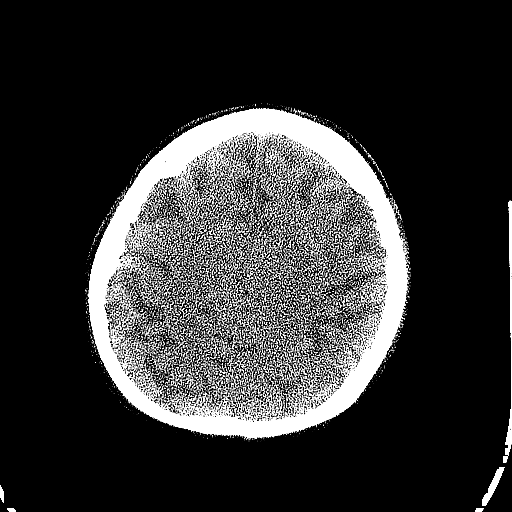
[im 68/85  brain]
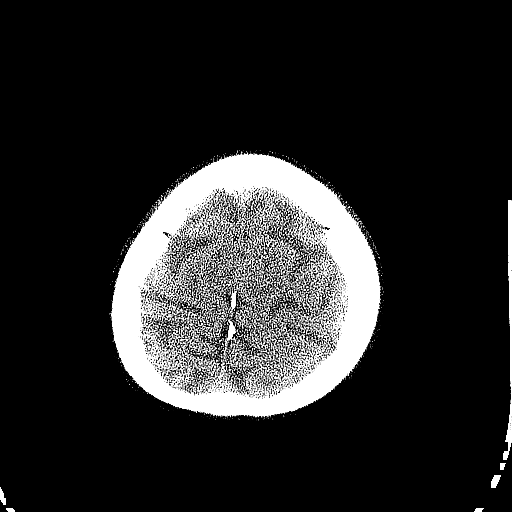
[im 76/85  brain]
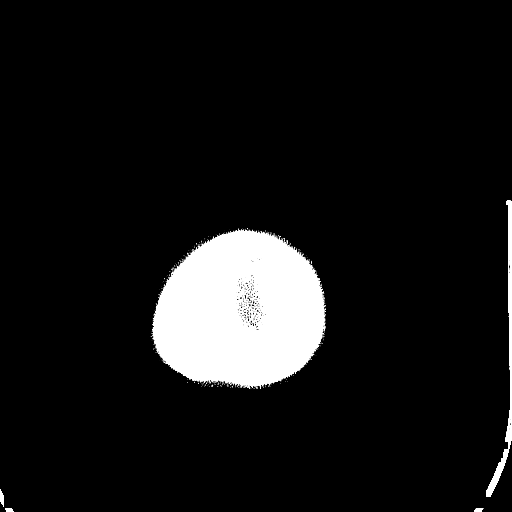
[im 76/85  bone]
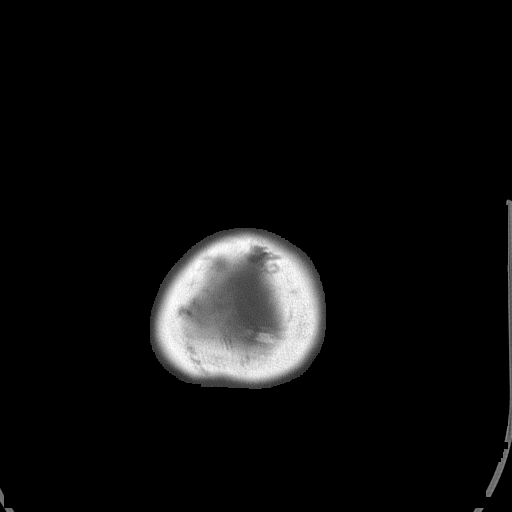

[Series 6: head 3.0 mpr sag · sagittal · 0.33mm/px · 3 of 57 slices shown]
[im 19/57  brain]
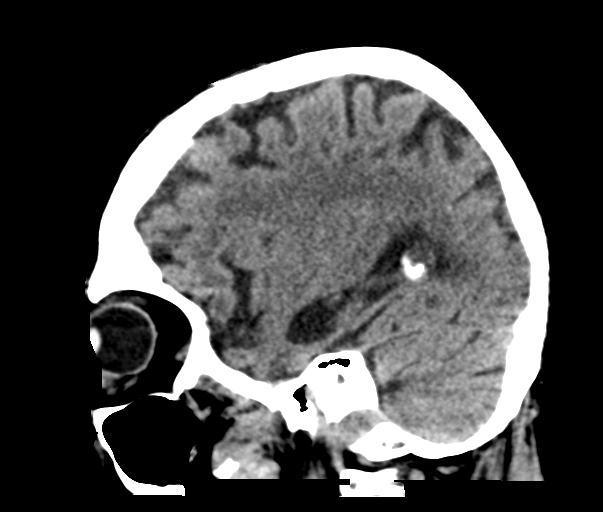
[im 29/57  brain]
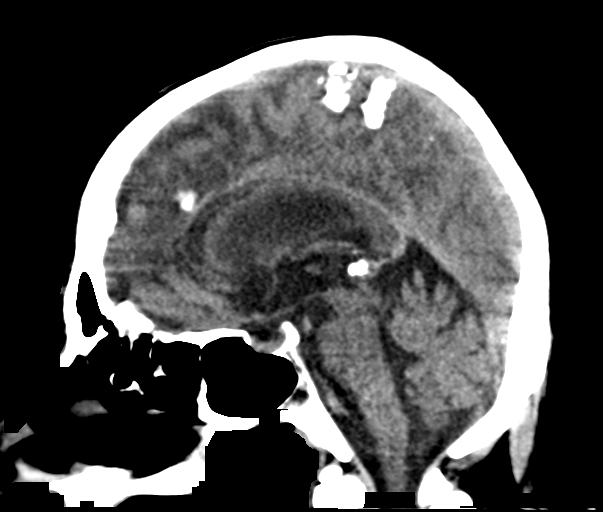
[im 38/57  brain]
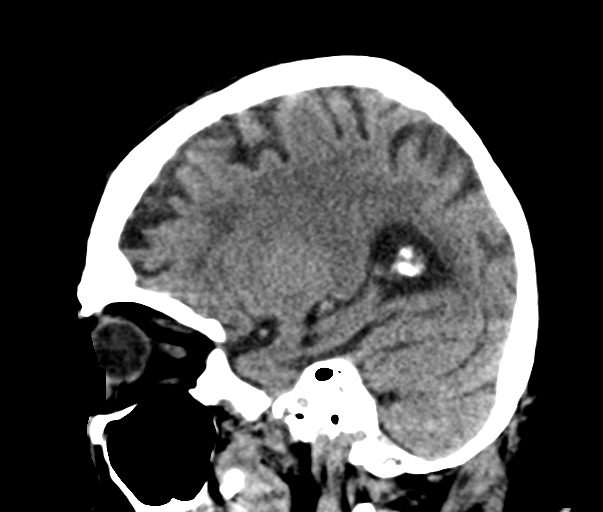

[15 of 47 positions shown; findings below may reference images not displayed]

FINDINGS: Brain: No evidence of acute infarction, hemorrhage, hydrocephalus,
extra-axial collection, visible mass lesion or mass effect. Diffuse
prominence of the ventricles, cisterns and sulci compatible with
parenchymal volume loss slightly more pronounced in the frontal
lobes and right temporal lobe. Patchy areas of white matter
hypoattenuation are most compatible with chronic microvascular
angiopathy.

Vascular: Atherosclerotic calcification of the carotid siphons and
intradural vertebral arteries. No hyperdense vessel.

Skull: Motion degradation is seen most pronounced towards the
vertex. Prominent venous [place] versus remote burr hole of the right
frontal bone. No calvarial fracture or suspicious osseous lesion. No
scalp swelling or hematoma.

Sinuses/Orbits: Paranasal sinuses and mastoid air cells are
predominantly clear. Included orbital structures are unremarkable.

Other: None.
IMPRESSION: 1. Motion degradation is seen most pronounced towards the vertex.
2. No definite acute intracranial abnormality.
3. Chronic microvascular angiopathy and parenchymal volume loss.
